# Patient Record
Sex: Male | Born: 1965 | Race: White | Hispanic: No | Marital: Married | Smoking: Never smoker
Health system: Southern US, Community
[De-identification: ages and names within clinical notes are randomized; demographics above are authoritative.]

## PROBLEM LIST (undated history)

## (undated) DIAGNOSIS — N200 Calculus of kidney: Secondary | ICD-10-CM

## (undated) DIAGNOSIS — Z9889 Other specified postprocedural states: Secondary | ICD-10-CM

## (undated) DIAGNOSIS — R112 Nausea with vomiting, unspecified: Secondary | ICD-10-CM

## (undated) DIAGNOSIS — Z8719 Personal history of other diseases of the digestive system: Secondary | ICD-10-CM

## (undated) DIAGNOSIS — Z8489 Family history of other specified conditions: Secondary | ICD-10-CM

## (undated) DIAGNOSIS — Z8739 Personal history of other diseases of the musculoskeletal system and connective tissue: Secondary | ICD-10-CM

## (undated) DIAGNOSIS — J189 Pneumonia, unspecified organism: Secondary | ICD-10-CM

## (undated) DIAGNOSIS — IMO0002 Reserved for concepts with insufficient information to code with codable children: Secondary | ICD-10-CM

## (undated) HISTORY — DX: Reserved for concepts with insufficient information to code with codable children: IMO0002

## (undated) HISTORY — PX: KIDNEY STONE SURGERY: SHX686

## (undated) HISTORY — PX: EYE SURGERY: SHX253

## (undated) HISTORY — PX: LITHOTRIPSY: SUR834

---
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
## 1989-08-05 HISTORY — PX: VARICOSE VEIN SURGERY: SHX832

## 1999-10-04 ENCOUNTER — Encounter: Payer: Self-pay | Admitting: Family Medicine

## 1999-10-04 LAB — CONVERTED CEMR LAB: Rapid Strep: NEGATIVE

## 2001-07-31 ENCOUNTER — Encounter: Payer: Self-pay | Admitting: Orthopedic Surgery

## 2001-07-31 ENCOUNTER — Ambulatory Visit (HOSPITAL_COMMUNITY): Admission: RE | Admit: 2001-07-31 | Discharge: 2001-07-31 | Payer: Self-pay | Admitting: Orthopedic Surgery

## 2002-08-06 ENCOUNTER — Encounter: Payer: Self-pay | Admitting: Family Medicine

## 2002-08-06 LAB — CONVERTED CEMR LAB
Blood Glucose, Fasting: 96 mg/dL
Cholesterol: 285 mg/dL
HDL: 35.7 mg/dL

## 2003-01-20 ENCOUNTER — Encounter: Payer: Self-pay | Admitting: Family Medicine

## 2003-01-20 LAB — CONVERTED CEMR LAB
ALT: 31 units/L
AST: 33 units/L
Cholesterol: 181 mg/dL
HDL: 41 mg/dL
LDL Cholesterol: 124 mg/dL
Triglyceride fasting, serum: 78 mg/dL

## 2004-01-28 ENCOUNTER — Encounter: Payer: Self-pay | Admitting: Family Medicine

## 2004-01-28 LAB — CONVERTED CEMR LAB
ALT: 28 units/L
AST: 29 units/L
Triglyceride fasting, serum: 44 mg/dL

## 2006-05-19 ENCOUNTER — Ambulatory Visit: Payer: Self-pay | Admitting: Family Medicine

## 2006-12-11 ENCOUNTER — Ambulatory Visit: Payer: Self-pay | Admitting: Family Medicine

## 2008-01-18 ENCOUNTER — Telehealth: Payer: Self-pay | Admitting: Family Medicine

## 2008-01-18 ENCOUNTER — Ambulatory Visit: Payer: Self-pay | Admitting: Family Medicine

## 2008-01-18 DIAGNOSIS — J180 Bronchopneumonia, unspecified organism: Secondary | ICD-10-CM | POA: Insufficient documentation

## 2009-05-07 ENCOUNTER — Ambulatory Visit: Payer: Self-pay | Admitting: Family Medicine

## 2009-05-08 ENCOUNTER — Encounter: Payer: Self-pay | Admitting: Family Medicine

## 2009-05-08 DIAGNOSIS — E7800 Pure hypercholesterolemia, unspecified: Secondary | ICD-10-CM

## 2009-05-08 LAB — CONVERTED CEMR LAB: Cholesterol: 285 mg/dL

## 2009-07-31 ENCOUNTER — Ambulatory Visit: Payer: Self-pay | Admitting: Family Medicine

## 2009-08-01 LAB — CONVERTED CEMR LAB
Albumin: 4.1 g/dL (ref 3.5–5.2)
Basophils Absolute: 0 10*3/uL (ref 0.0–0.1)
Basophils Relative: 0 % (ref 0.0–3.0)
Bilirubin, Direct: 0 mg/dL (ref 0.0–0.3)
CO2: 27 meq/L (ref 19–32)
Calcium: 9 mg/dL (ref 8.4–10.5)
Chloride: 110 meq/L (ref 96–112)
Cholesterol: 209 mg/dL — ABNORMAL HIGH (ref 0–200)
Creatinine, Ser: 0.9 mg/dL (ref 0.4–1.5)
Direct LDL: 129.9 mg/dL
Eosinophils Absolute: 0.2 10*3/uL (ref 0.0–0.7)
Glucose, Bld: 97 mg/dL (ref 70–99)
MCHC: 34.3 g/dL (ref 30.0–36.0)
MCV: 91.5 fL (ref 78.0–100.0)
Monocytes Absolute: 0.4 10*3/uL (ref 0.1–1.0)
Neutro Abs: 2.8 10*3/uL (ref 1.4–7.7)
Neutrophils Relative %: 47.6 % (ref 43.0–77.0)
PSA: 0.31 ng/mL (ref 0.10–4.00)
RBC: 4.8 M/uL (ref 4.22–5.81)
RDW: 11.7 % (ref 11.5–14.6)
Total CHOL/HDL Ratio: 6
Total Protein: 6.5 g/dL (ref 6.0–8.3)
Triglycerides: 148 mg/dL (ref 0.0–149.0)

## 2009-08-06 ENCOUNTER — Ambulatory Visit: Payer: Self-pay | Admitting: Family Medicine

## 2010-01-27 ENCOUNTER — Ambulatory Visit: Payer: Self-pay | Admitting: Family Medicine

## 2010-01-27 DIAGNOSIS — J018 Other acute sinusitis: Secondary | ICD-10-CM

## 2010-02-09 ENCOUNTER — Telehealth: Payer: Self-pay | Admitting: Family Medicine

## 2010-07-07 ENCOUNTER — Encounter (INDEPENDENT_AMBULATORY_CARE_PROVIDER_SITE_OTHER): Payer: Self-pay | Admitting: *Deleted

## 2010-09-10 ENCOUNTER — Emergency Department (HOSPITAL_COMMUNITY): Admission: EM | Admit: 2010-09-10 | Discharge: 2010-09-10 | Payer: Self-pay | Admitting: Emergency Medicine

## 2010-09-20 ENCOUNTER — Observation Stay (HOSPITAL_COMMUNITY)
Admission: EM | Admit: 2010-09-20 | Discharge: 2010-09-22 | Payer: Self-pay | Source: Home / Self Care | Admitting: Emergency Medicine

## 2010-09-20 ENCOUNTER — Ambulatory Visit (HOSPITAL_COMMUNITY): Admission: RE | Admit: 2010-09-20 | Discharge: 2010-09-20 | Payer: Self-pay | Admitting: Urology

## 2010-09-24 ENCOUNTER — Ambulatory Visit (HOSPITAL_COMMUNITY): Admission: AD | Admit: 2010-09-24 | Discharge: 2010-09-24 | Payer: Self-pay | Admitting: Urology

## 2010-10-04 ENCOUNTER — Ambulatory Visit (HOSPITAL_COMMUNITY): Admission: RE | Admit: 2010-10-04 | Discharge: 2010-10-04 | Payer: Self-pay | Admitting: Urology

## 2011-01-04 NOTE — Progress Notes (Signed)
Summary: still with cough and congestion  Phone Note Call from Patient Call back at [facility] Phone [phone]   Caller: Patient Summary of Call: Pt was seen on 2/23 and given a z-pack.  He still has cough and congestion, not able to sleep due to cough.  He is asking for a refill on z-pack, [facility].  No known fever.  He has been taking guafenesin, not helping. Initial call taken by: Lowella Petties CMA,  February 09, 2010 10:23 AM  Follow-up for Phone Call        If he feels he needs another Z p[ak, he needs to be seen. Would suggest Take Guaifenesin by [facility] or [facility] and getting MUCOUS RELIEF EXPECTORANT (400mg ), take 11/2 tabs by mouth AM and NOON. Drink lots of fluids anytime taking Guaifenesin. BE VERY SPECIFIC WITH THIS, NOT MUCINEX. Keep lozenge in mouth to cut down on rective cough. Take Tessalon during the day three times a day as needed for cough, Take Tussionex at night as needed for cough. Sxs may last another 2-3 weeks. Follow-up by: Shaune Leeks MD,  February 09, 2010 12:31 PM  Additional Follow-up for Phone Call Additional follow up Details #1::        Advised pt. Additional Follow-up by: Lowella Petties CMA,  February 09, 2010 12:37 PM    New/Updated Medications: TESSALON 200 MG CAPS (BENZONATATE) one tab [o three times a day as needed for cough TUSSIONEX PENNKINETIC ER 8-10 MG/5ML LQCR (CHLORPHENIRAMINE-HYDROCODONE) one tsp by mouth at night as needed for cough. Prescriptions: TUSSIONEX PENNKINETIC ER 8-10 MG/5ML LQCR (CHLORPHENIRAMINE-HYDROCODONE) one tsp by mouth at night as needed for cough.  #8 oz x 0   Entered and Authorized by:   Shaune Leeks MD   Signed by:   Shaune Leeks MD on 02/09/2010   Method used:   Telephoned to ...       [facility] [id number]       [address]       [place], [place]  [address]       Ph: [phone] or [phone]       Fax: [phone]   RxID:    [id number] TESSALON 200 MG CAPS (BENZONATATE) one tab [o three times a day as needed for cough  #20 x 0   Entered and Authorized by:   Shaune Leeks MD   Signed by:   Shaune Leeks MD on 02/09/2010   Method used:   Electronically to        [facility] (retail)       [address]       [place], [place]  [address]       Ph: [phone] or [phone]       Fax: [phone]   RxID:   [id number]

## 2011-01-04 NOTE — Assessment & Plan Note (Signed)
Summary: CONGESTION,COUGH,BODY ACHES/CLE   Vital Signs:  Patient profile:   45 year old male Height:      65 inches Weight:      219.50 pounds BMI:     36.66 Temp:     98.5 degrees F oral Pulse rate:   80 / minute Pulse rhythm:   regular BP sitting:   120 / 84  (left arm) Cuff size:   large  Vitals Entered By: Delilah Shan CMA Duncan Dull) (January 27, 2010 11:32 AM) CC: Congestion, cough, body aches   History of Present Illness: 45 yo with 10 days of congestion, sinus pressure, productive cough. No SOB, fevers, CP, or wheezing. Did have body aches and sweats last night. Taking Theraflu with some relief of symptoms.   Current Medications (verified): 1)  Advil 200 Mg Tabs (Ibuprofen) .... Otc Take As Directed When Needed. 2)  Omega-3 Fish Oil 1000 Mg Caps (Omega-3 Fatty Acids) .Marland Kitchen.. 1 Cpsule Twice A Day 3)  Multivitamins   Tabs (Multiple Vitamin) .... Take 1 Tablet By Mouth Once A Day 4)  Vitamin B-12 500 Mcg  Tabs (Cyanocobalamin) .... Take 1 Tablet By Mouth Once A Day 5)  Aspirin 325 Mg  Tabs (Aspirin) .... Take 1 Tablet By Mouth Once A Day 6)  Azithromycin 250 Mg  Tabs (Azithromycin) .... 2 By  Mouth Today and Then 1 Daily For 4 Days  Allergies: 1)  ! Penicillin  Review of Systems      See HPI General:  Complains of sweats; denies chills and fever. ENT:  Complains of nasal congestion and sinus pressure; denies earache and sore throat. CV:  Denies chest pain or discomfort. Resp:  Complains of cough and sputum productive; denies shortness of breath and wheezing. GI:  Denies abdominal pain, nausea, and vomiting. MS:  Complains of muscle aches; denies muscle weakness. Derm:  Denies rash.  Physical Exam  General:  alert, well-developed, well-nourished, and well-hydrated, sounds congested. Ears:  TMs retracted bilaterally Nose:  nasal dischargemucosal pallor.   sinuses neg Mouth:  Oral mucosa and oropharynx without lesions or exudates.  Teeth in good repair. Lungs:  Normal  respiratory effort, chest expands symmetrically. Lungs are clear to auscultation, no crackles or wheezes. Heart:  Normal rate and regular rhythm. S1 and S2 normal without gallop, murmur, click, rub or other extra sounds. Extremities:  No clubbing, cyanosis, edema, or deformity noted with normal full range of motion of all joints.   Psych:  normally interactive and good eye contact.     Impression & Recommendations:  Problem # 1:  OTHER ACUTE SINUSITIS (ICD-461.8) Assessment New given duration and progression of symptoms, will treat for bacterial sinusitis. Has allergy to PCN, treat with Zpack. Continue supportive care, see pt instructions for details. His updated medication list for this problem includes:    Azithromycin 250 Mg Tabs (Azithromycin) .Marland Kitchen... 2 by  mouth today and then 1 daily for 4 days  Complete Medication List: 1)  Advil 200 Mg Tabs (Ibuprofen) .... Otc take as directed when needed. 2)  Omega-3 Fish Oil 1000 Mg Caps (Omega-3 fatty acids) .Marland Kitchen.. 1 cpsule twice a day 3)  Multivitamins Tabs (Multiple vitamin) .... Take 1 tablet by mouth once a day 4)  Vitamin B-12 500 Mcg Tabs (Cyanocobalamin) .... Take 1 tablet by mouth once a day 5)  Aspirin 325 Mg Tabs (Aspirin) .... Take 1 tablet by mouth once a day 6)  Azithromycin 250 Mg Tabs (Azithromycin) .... 2 by  mouth today and then  1 daily for 4 days  Patient Instructions: 1)  Take antibiotic as directed.  Drink lots of fluids.  Treat sympotmatically with Mucinex, nasal saline irrigation, and Tylenol/Ibuprofen. Also try claritin D or zyrtec D over the counter- two times a day as needed ( have to sign for them at pharmacy). You can use warm compresses.  Cough suppressant at night. Call if not improving as expected in 5-7 days.  Prescriptions: AZITHROMYCIN 250 MG  TABS (AZITHROMYCIN) 2 by  mouth today and then 1 daily for 4 days  #6 x 0   Entered and Authorized by:   Ruthe Mannan MD   Signed by:   Ruthe Mannan MD on 01/27/2010   Method  used:   Electronically to        [facility] (retail)       [address]       [place], [place]  [address]       Ph: [phone] or [phone]       Fax: [phone]   RxID:   [id number]   Current Allergies (reviewed today): ! PENICILLIN

## 2011-01-04 NOTE — Letter (Signed)
Summary: Nadara Eaton letter  [facility] at [facility]  [address] [place], [place] [address]   Phone: [phone]  Fax: [phone]       07/07/2010 MRN: [id number]  [facility] [place] [address] [place], [place]  [address]  Dear Mr. Hirschman,  [facility] [place] and [facility] announce the retirement of Arta Silence, M.D., from full-time practice at the [facility] office effective June 03, 2010 and his plans of returning part-time.  It is important to Dr. Hetty Ely and to our practice that you understand that [facility] has seven physicians in our office for your health care needs.  We will continue to offer the same exceptional care that you have today.    Dr. Hetty Ely has spoken to many of you about his plans for retirement and returning part-time in the fall.   We will continue to work with you through the transition to schedule appointments for you in the office and meet the high standards that [facility] is committed to.   Again, it is with great pleasure that we share the news that Dr. Hetty Ely will return to [facility] at [facility] in October of 2011 with a reduced schedule.    If you have any questions, or would like to request an appointment with one of our physicians, please call us at [phone] and press the option for Scheduling an appointment.  We take pleasure in providing you with excellent patient care and look forward to seeing you at your next office visit.  Our [facility] Physicians are:  Tillman Abide, M.D. Laurita Quint, M.D. Roxy Manns, M.D. Kerby Nora, M.D. Hannah Beat, M.D. Ruthe Mannan, M.D. We proudly welcomed Raechel Ache, M.D. and Eustaquio Boyden, M.D. to the practice in July/August 2011.  Sincerely,  [facility]

## 2011-02-16 LAB — URINE CULTURE: Culture: NO GROWTH

## 2011-02-16 LAB — SURGICAL PCR SCREEN
Staphylococcus aureus: POSITIVE — AB
Staphylococcus aureus: POSITIVE — AB

## 2011-02-17 LAB — URINALYSIS, ROUTINE W REFLEX MICROSCOPIC
Bilirubin Urine: NEGATIVE
Specific Gravity, Urine: 1.031 — ABNORMAL HIGH (ref 1.005–1.030)
pH: 5.5 (ref 5.0–8.0)

## 2011-02-17 LAB — URINE MICROSCOPIC-ADD ON

## 2011-11-03 ENCOUNTER — Encounter: Payer: Self-pay | Admitting: Family Medicine

## 2011-11-03 ENCOUNTER — Ambulatory Visit (INDEPENDENT_AMBULATORY_CARE_PROVIDER_SITE_OTHER): Payer: BC Managed Care – PPO | Admitting: Family Medicine

## 2011-11-03 DIAGNOSIS — J111 Influenza due to unidentified influenza virus with other respiratory manifestations: Secondary | ICD-10-CM | POA: Insufficient documentation

## 2011-11-03 DIAGNOSIS — E7800 Pure hypercholesterolemia, unspecified: Secondary | ICD-10-CM

## 2011-11-03 MED ORDER — OSELTAMIVIR PHOSPHATE 75 MG PO CAPS: 75.0000 mg | ORAL_CAPSULE | Freq: Two times a day (BID) | ORAL | Status: AC

## 2011-11-03 NOTE — Assessment & Plan Note (Signed)
Needs to be rechecked. HE will schedule PE in future.

## 2011-11-03 NOTE — Patient Instructions (Signed)
Tamiflu twice a day for 5 days. Cont to take Guaifenesin (400mg ), take 11/2 tabs by mouth AM and NOON. Get GUAIFENESIN by  [facility] and getting MUCOUS RELIEF EXPECTORANT/CONGESTION. DO NOT GET MUCINEX (Timed Release Guaifenesin)

## 2011-11-03 NOTE — Progress Notes (Signed)
  Subjective:    Patient ID: Johnathan [name], male    DOB: 1966/04/22, 45 y.o.   MRN: [id number]  HPI Pt here as acute appt for congestion, body aches, fever and cough. He feels like he has been run over by a truck. He had fever yesterday but did not check it. With two tyl in him he had temp of 101.6 here. He has bad headache in the frontal area, worsened significantly with cough, no overt ear pain, some organizing ST, no rhinitis, cough productive of yellow sputum, tries not to cough due to headache, no N/V prior but some nausea this AM, no diarrhea, no SOB. He has taken plain Tussin and some tylenol. He also haas some guaifenesin.     Review of SystemsNoncontributory except as above.       Objective:   Physical Exam  Constitutional: He appears well-developed and well-nourished. No distress.       Looks slightly tired.  HENT:  Head: Normocephalic and atraumatic.  Right Ear: External ear normal.  Left Ear: External ear normal.  Nose: Nose normal.  Mouth/Throat: Oropharynx is clear and moist.       TMs dull but nonerythem.  Eyes: Conjunctivae and EOM are normal. Pupils are equal, round, and reactive to light. Right eye exhibits no discharge. Left eye exhibits no discharge.  Neck: Normal range of motion. Neck supple.  Cardiovascular: Normal rate and regular rhythm.   Pulmonary/Chest: Effort normal and breath sounds normal. He has no wheezes.       Slight ronchi, clear with cough.  Lymphadenopathy:    He has no cervical adenopathy.  Skin: He is not diaphoretic.          Assessment & Plan:

## 2011-11-03 NOTE — Assessment & Plan Note (Signed)
First case seen by me this year. Will treat with Tamiflu. See instructions.

## 2011-11-16 ENCOUNTER — Other Ambulatory Visit (HOSPITAL_COMMUNITY): Payer: Self-pay

## 2011-11-16 ENCOUNTER — Emergency Department (HOSPITAL_COMMUNITY)
Admission: EM | Admit: 2011-11-16 | Discharge: 2011-11-16 | Disposition: A | Discharge status: Home / Self Care | Payer: BC Managed Care – PPO | Attending: Emergency Medicine | Admitting: Emergency Medicine

## 2011-11-16 ENCOUNTER — Emergency Department (HOSPITAL_COMMUNITY): Payer: BC Managed Care – PPO

## 2011-11-16 ENCOUNTER — Encounter (HOSPITAL_COMMUNITY): Payer: Self-pay | Admitting: Adult Health

## 2011-11-16 DIAGNOSIS — M79609 Pain in unspecified limb: Secondary | ICD-10-CM | POA: Insufficient documentation

## 2011-11-16 DIAGNOSIS — M722 Plantar fascial fibromatosis: Secondary | ICD-10-CM | POA: Insufficient documentation

## 2011-11-16 MED ORDER — OXYCODONE-ACETAMINOPHEN 5-325 MG PO TABS
1.0000 | ORAL_TABLET | Freq: Once | ORAL | Status: AC
  Administered 2011-11-16: 1 via ORAL
  Filled 2011-11-16: qty 1

## 2011-11-16 MED ORDER — NAPROXEN 500 MG PO TABS: 500.0000 mg | ORAL_TABLET | Freq: Two times a day (BID) | ORAL | Status: AC

## 2011-11-16 MED ORDER — HYDROCODONE-ACETAMINOPHEN 5-325 MG PO TABS: 1.0000 | ORAL_TABLET | ORAL | Status: AC | PRN

## 2011-11-16 NOTE — ED Notes (Signed)
C/o right foot pain. Right ankle edema no redness tender to touch, unable to bear weight on right foot.

## 2011-11-16 NOTE — ED Provider Notes (Signed)
History     CSN: [id number] Arrival date & time: 11/16/2011  4:44 PM   First MD Initiated Contact with Patient 11/16/11 1823      Chief Complaint  Patient presents with  . Foot Pain    (Consider location/radiation/quality/duration/timing/severity/associated sxs/prior treatment) Patient is a 45 y.o. male presenting with lower extremity pain. The history is provided by the patient.  Foot Pain This is a new problem. The current episode started yesterday. Associated symptoms include arthralgias. Pertinent negatives include no chills, fever or joint swelling. The symptoms are aggravated by walking and standing. He has tried nothing for the symptoms.  Pt states he had no injuries to his foot. States at work, was standing on his feet a lot, and developed pain under right heel and right arch of the foot. States pain is worsening. Denies fever, chills, malaise, other previous previous similar episodes.   Past Medical History  Diagnosis Date  . Laceration 1985    left forearm arterial laceration    Past Surgical History  Procedure Date  . Varicose vein surgery 1990's    left thigh, Dr.Humphreys  . 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 Dementia Mother   . Diabetes Mother   . Heart disease Father   . Depression Father     manic  . Hypertension Father   . Alzheimer's disease Father     History  Substance Use Topics  . Smoking status: Never Smoker   . Smokeless tobacco: Never Used  . Alcohol Use: Yes     rarely      Review of Systems  Constitutional: Negative for fever and chills.  HENT: Negative.   Eyes: Negative.   Respiratory: Negative.   Cardiovascular: Negative.   Gastrointestinal: Negative.   Genitourinary: Negative.   Musculoskeletal: Positive for arthralgias. Negative for back pain and joint swelling.  Neurological: Negative.   Psychiatric/Behavioral: Negative.      Allergies  Penicillins  Home Medications   Current Outpatient Rx  Name Route Sig Dispense Refill  . ACETAMINOPHEN 500 MG PO TABS Oral Take 500 mg by mouth as needed. For headache or foot pain    . IBUPROFEN 200 MG PO TABS Oral Take 200 mg by mouth as needed. Headache or foot pain    . OMEGA-3-6-9 PO Oral Take by mouth 2 (two) times daily.        BP 156/98  Pulse 78  Temp(Src) 98.9 F (37.2 C) (Oral)  Resp 20  SpO2 98%  Physical Exam  Nursing note and vitals reviewed. Constitutional: He is oriented to person, place, and time. He appears well-developed and well-nourished. No distress.  HENT:  Head: Normocephalic and atraumatic.  Neck: Neck supple.  Cardiovascular: Normal rate, regular rhythm and normal heart sounds.   Pulmonary/Chest: Effort normal and breath sounds normal. No respiratory distress.  Musculoskeletal:       Normal appearing right foot. Tenderness to palpation under the right heel and right plantar fascia. Pain with foot dorsiflexion. Normal ROM of all toes.   Neurological: He is alert and oriented to person, place, and time.  Skin: Skin is warm and dry.  Psychiatric: He has a normal mood and affect.    ED Course  Procedures (including critical care time)  Labs Reviewed - No data to display Dg Foot Complete Right  11/16/2011  *RADIOLOGY REPORT*  Clinical Data: Pain.  Distant history of surgery.  RIGHT FOOT COMPLETE - 3+ VIEW  Comparison: None.  Findings: No evidence of fracture.  No apparent ankle joint effusion.  No abnormalities seen in the forefoot.  There is a small subchondral cyst within the navicular, probably insignificant.  I do not see a advanced degenerative changes in the midfoot.  No detectable coalition.  There is some calcification within the distal Achilles tendon.  IMPRESSION: No cause of pain identified.  No acute finding.  No significant degenerative changes.  No detectable coalition.  Achilles tendon calcification incidentally noted.   Original Report Authenticated By: Thomasenia Sales, M.D.    Exam suspicious for plantar fasciatis. Negative x-ray. Will d/c home with orthopedics follow up. Advise to get arch supports, wear good shoes.   MDM          Lottie Mussel, PA 11/16/11 1924

## 2011-11-16 NOTE — ED Provider Notes (Signed)
Medical screening examination/treatment/procedure(s) were performed by non-physician practitioner and as supervising physician I was immediately available for consultation/collaboration.  Flint Melter, MD 11/16/11 2329

## 2014-01-25 ENCOUNTER — Emergency Department (HOSPITAL_COMMUNITY)
Admission: EM | Admit: 2014-01-25 | Discharge: 2014-01-25 | Disposition: A | Discharge status: Home / Self Care | Payer: BC Managed Care – PPO | Attending: Emergency Medicine | Admitting: Emergency Medicine

## 2014-01-25 ENCOUNTER — Emergency Department (HOSPITAL_COMMUNITY): Payer: BC Managed Care – PPO

## 2014-01-25 ENCOUNTER — Encounter (HOSPITAL_COMMUNITY): Payer: Self-pay | Admitting: Emergency Medicine

## 2014-01-25 DIAGNOSIS — S0990XA Unspecified injury of head, initial encounter: Secondary | ICD-10-CM

## 2014-01-25 DIAGNOSIS — Z880 Allergy status to penicillin: Secondary | ICD-10-CM | POA: Insufficient documentation

## 2014-01-25 DIAGNOSIS — Y939 Activity, unspecified: Secondary | ICD-10-CM | POA: Insufficient documentation

## 2014-01-25 DIAGNOSIS — Y929 Unspecified place or not applicable: Secondary | ICD-10-CM | POA: Insufficient documentation

## 2014-01-25 DIAGNOSIS — S060XAA Concussion with loss of consciousness status unknown, initial encounter: Secondary | ICD-10-CM | POA: Insufficient documentation

## 2014-01-25 DIAGNOSIS — W010XXA Fall on same level from slipping, tripping and stumbling without subsequent striking against object, initial encounter: Secondary | ICD-10-CM | POA: Insufficient documentation

## 2014-01-25 DIAGNOSIS — S060X9A Concussion with loss of consciousness of unspecified duration, initial encounter: Secondary | ICD-10-CM

## 2014-01-25 DIAGNOSIS — R110 Nausea: Secondary | ICD-10-CM | POA: Insufficient documentation

## 2014-01-25 DIAGNOSIS — Z79899 Other long term (current) drug therapy: Secondary | ICD-10-CM | POA: Insufficient documentation

## 2014-01-25 MED ORDER — ONDANSETRON 4 MG PO TBDP
4.0000 mg | ORAL_TABLET | Freq: Once | ORAL | Status: AC
  Administered 2014-01-25: 4 mg via ORAL
  Filled 2014-01-25: qty 1

## 2014-01-25 NOTE — ED Provider Notes (Signed)
CSN: [id number]     Arrival date & time 01/25/14  0719 History   First MD Initiated Contact with Patient 01/25/14 [phone]     Chief Complaint  Patient presents with  . Head Injury  . Fall     (Consider location/radiation/quality/duration/timing/severity/associated sxs/prior Treatment) HPI  48 year old male presenting with wife after a fall just before arrival. Patient slipped on an icy surface and fell backwards. Patient was filled lying on his back after someone near him or heard a loud noise. Does not appear to loss consciousness. Patient is amnestic to the events. He is unsure what happened or how you got to the hospital. Is complaining of nausea and posterior headache. Denies any neck or back pain. No use of blood thinning medicines. No acute numbness, to loss of strength. No visual complaints. Per wife, repettitive questioning. Amnestic to very recent events.   Past Medical History  Diagnosis Date  . Laceration 1985    left forearm arterial laceration   Past Surgical History  Procedure Laterality Date  . Varicose vein surgery  1990's    left thigh, Dr.Humphreys  . Club foot release  1974    clubfoot surgical correction bilaterial, Dr.Carter   Family History  Problem Relation Age of Onset  . Dementia Mother   . Diabetes Mother   . Heart disease Father   . Depression Father     manic  . Hypertension Father   . Alzheimer's disease Father    History  Substance Use Topics  . Smoking status: Never Smoker   . Smokeless tobacco: Never Used  . Alcohol Use: Yes     Comment: rarely    Review of Systems  All systems reviewed and negative, other than as noted in HPI.   Allergies  Penicillins  Home Medications   Current Outpatient Rx  Name  Route  Sig  Dispense  Refill  . acetaminophen (TYLENOL) 500 MG tablet   Oral   Take 500 mg by mouth as needed. For headache or foot pain         . ibuprofen (ADVIL,MOTRIN) 200 MG tablet   Oral   Take 200 mg by mouth as needed.  Headache or foot pain         . Omega 3-6-9 Fatty Acids (OMEGA-3-6-9 PO)   Oral   Take by mouth 2 (two) times daily.            BP 134/95  Pulse 70  Resp 18  SpO2 97% Physical Exam  Nursing note and vitals reviewed. Constitutional: He appears well-developed and well-nourished. No distress.  HENT:  Head: Normocephalic and atraumatic.  Faint erythema posterior scalp. No break in skin. No cephalohematoma.   Eyes: Conjunctivae are normal. Right eye exhibits no discharge. Left eye exhibits no discharge.  Neck: Neck supple.  Cardiovascular: Normal rate, regular rhythm and normal heart sounds.  Exam reveals no gallop and no friction rub.   No murmur heard. Pulmonary/Chest: Effort normal and breath sounds normal. No respiratory distress.  Abdominal: Soft. He exhibits no distension. There is no tenderness.  Musculoskeletal: He exhibits no edema and no tenderness.  No midline spinal tenderness. No bony tenderness of extremities or apparent pain with ROM of large joints.   Neurological: He is alert.  GCS 14. Amnestic to events earlier today. Responded 2013 when asked what year it is. Couldn't tell me month or day of week. CN 2-12 intact. Strength 5/5 b/l u/l extremities. Gait steady.   Skin: Skin is warm and  dry.  Psychiatric: He has a normal mood and affect. His behavior is normal. Thought content normal.    ED Course  Procedures (including critical care time) Labs Review Labs Reviewed - No data to display Imaging Review No results found.  EKG Interpretation   None       MDM   Final diagnoses:  Head injury, acute  Concussion    42yM with a head injury. Symptoms of concussion. No blood thinners. CT w/o acute traumatic injury. Discussed post-concussive syndrome with pt/wife. Return precautions discussed.    Raeford RazorStephen Kree Rafter, MD 01/25/14 [phone]

## 2014-01-25 NOTE — Discharge Instructions (Signed)

## 2014-01-25 NOTE — ED Notes (Signed)
Pt returned from CT °

## 2014-01-25 NOTE — ED Notes (Addendum)
Pt from home c/o headache  D/t to fall today. Pt slipped on the ice and hit his head. He is unable to recall the fall and repeats " I can't remember". He is aware of where he is and who the president but not the month. He doesn't recall what he suppose to do today or where he was going today. No use of blood thinners. No visual changes. C/o of nausea w/o vomiting.

## 2014-12-24 IMAGING — CT CT HEAD W/O CM
2 series · 17 of 30 positions shown, 20 images · non-contrast
Comparison: None.

CLINICAL DATA: Headache status post fall.  Injury to back of head.

EXAM:
CT HEAD WITHOUT CONTRAST
TECHNIQUE: Contiguous axial images were obtained from the base of the skull
through the vertex without intravenous contrast.

[Series 2: head w/o · axial · non-contrast · 0.48mm/px · z∈[-149,-29]mm · 9 of 30 slices shown, 12 images]
[im 3/30  brain]
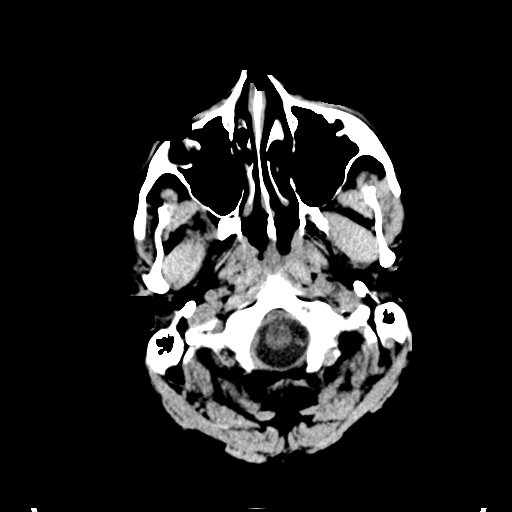
[im 3/30  bone]
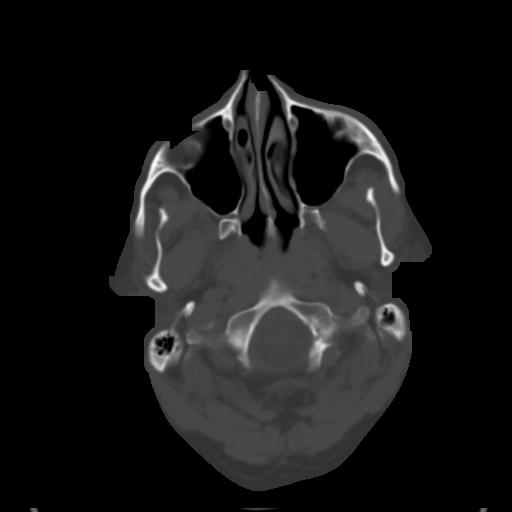
[im 6/30  brain]
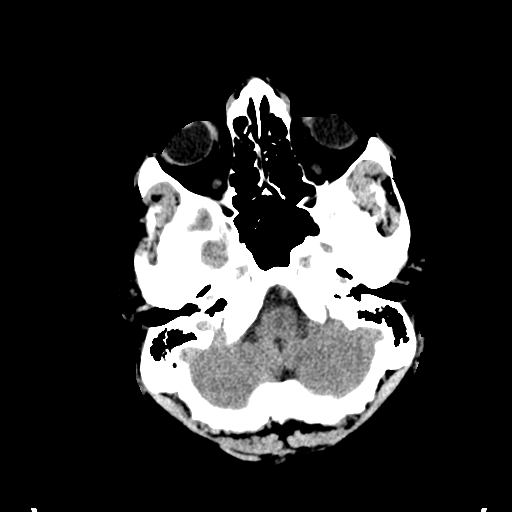
[im 9/30  brain]
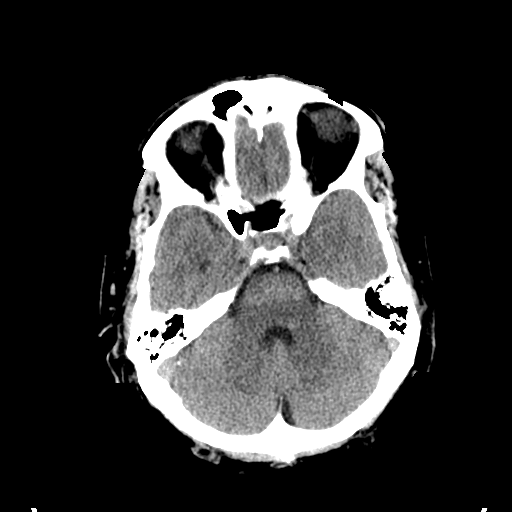
[im 12/30  brain]
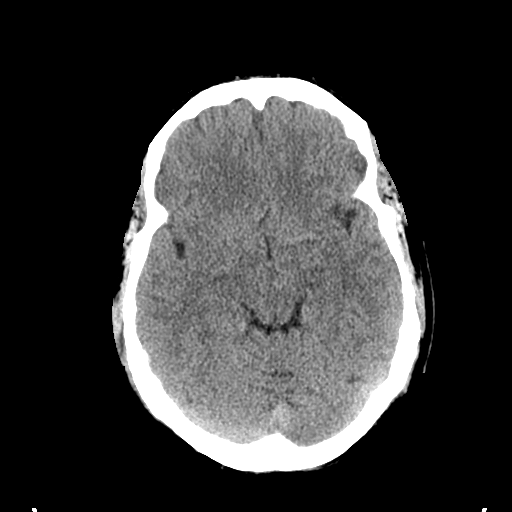
[im 15/30  brain]
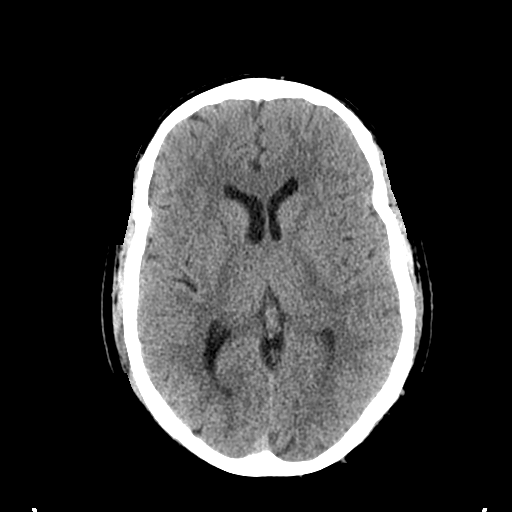
[im 15/30  bone]
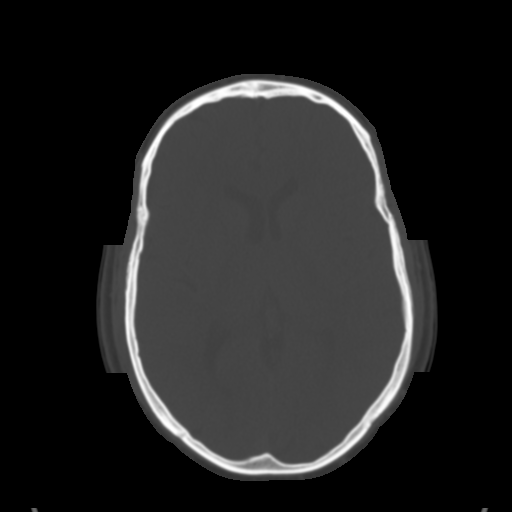
[im 18/30  brain]
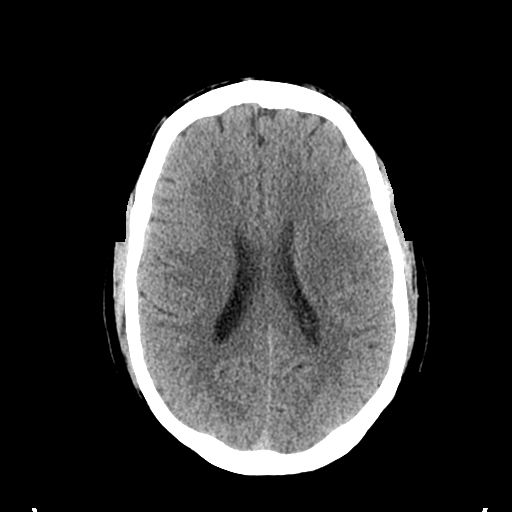
[im 21/30  brain]
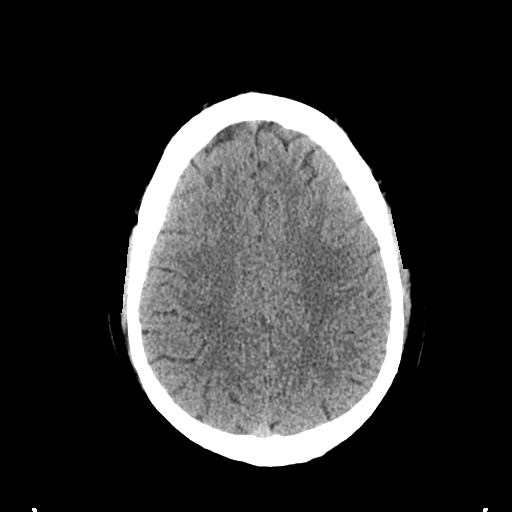
[im 24/30  brain]
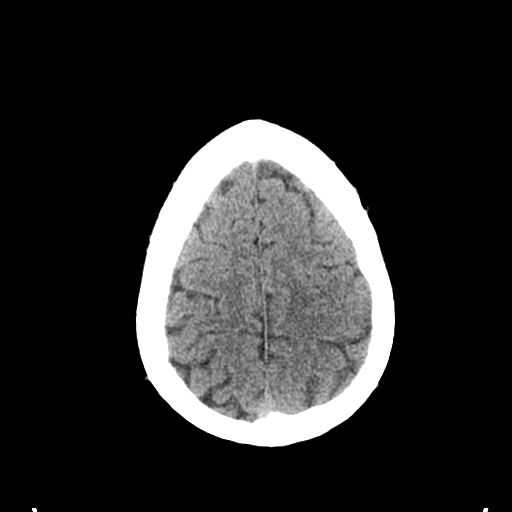
[im 27/30  brain]
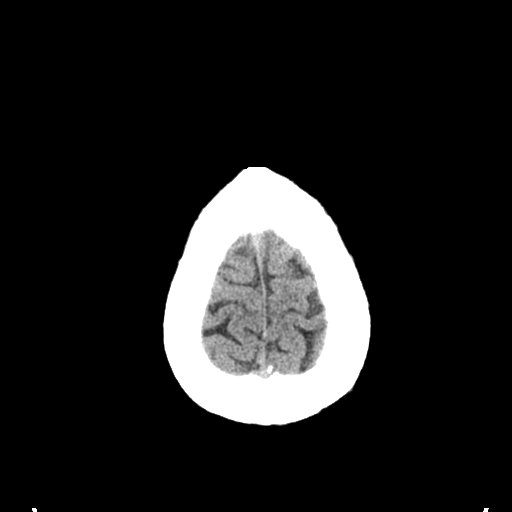
[im 27/30  bone]
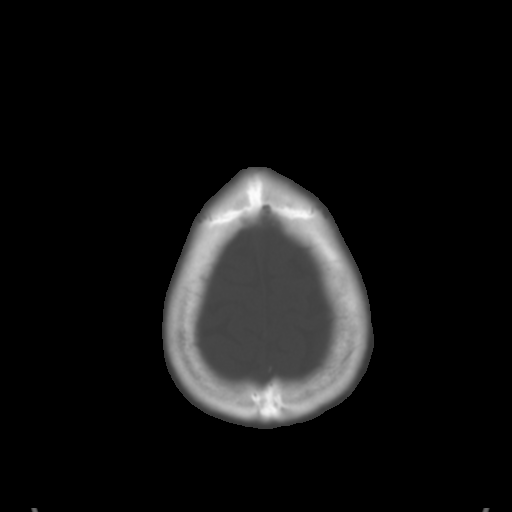

[Series 3: bone windows · axial · 0.48mm/px · z∈[-144,-30]mm · 8 of 50 slices shown]
[im 6/50  bone]
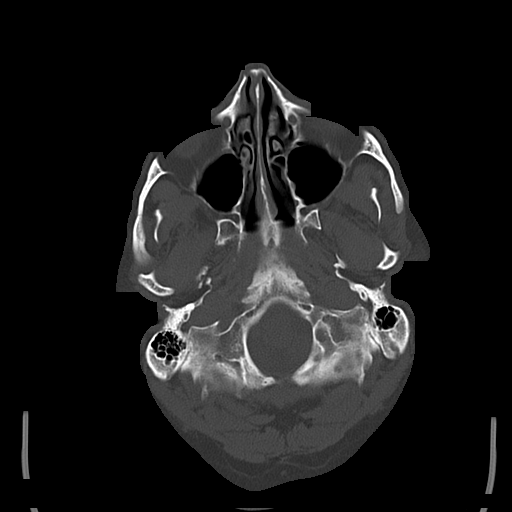
[im 11/50  bone]
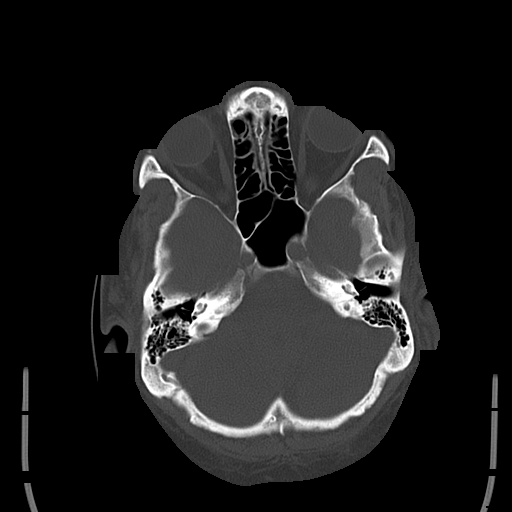
[im 17/50  bone]
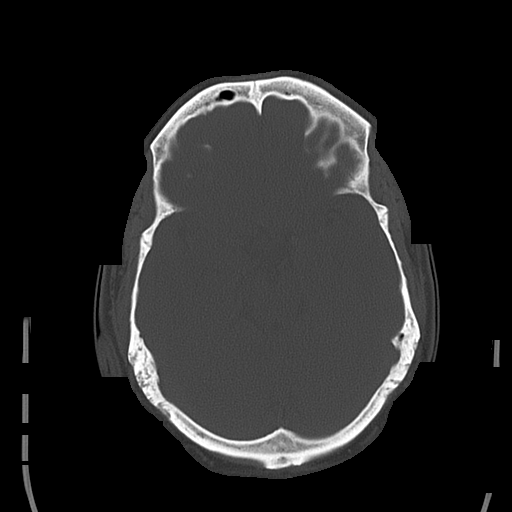
[im 22/50  bone]
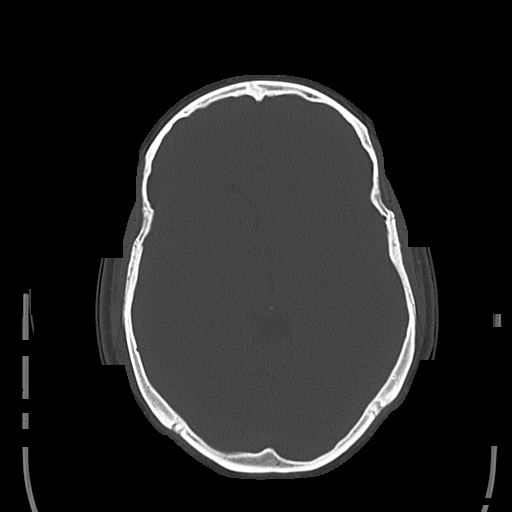
[im 28/50  bone]
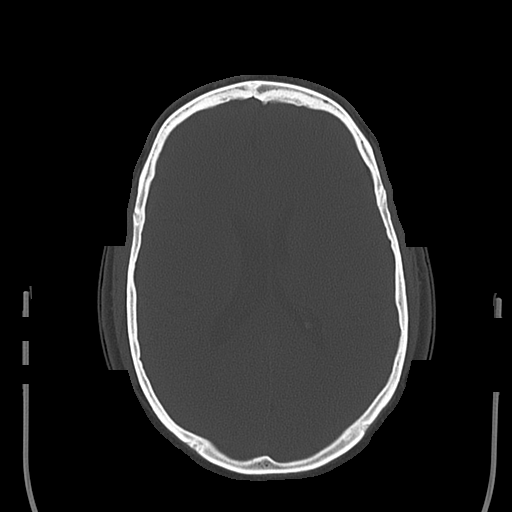
[im 33/50  bone]
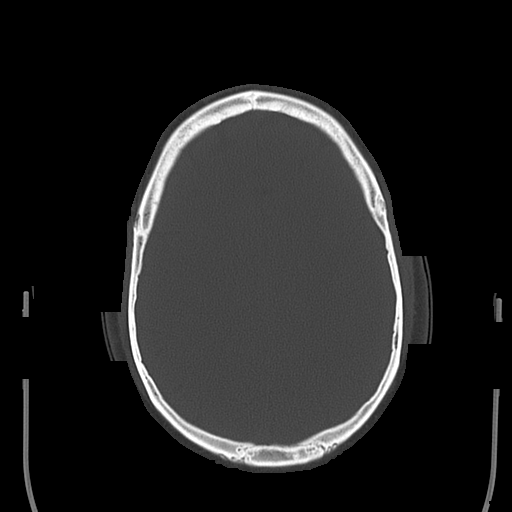
[im 39/50  bone]
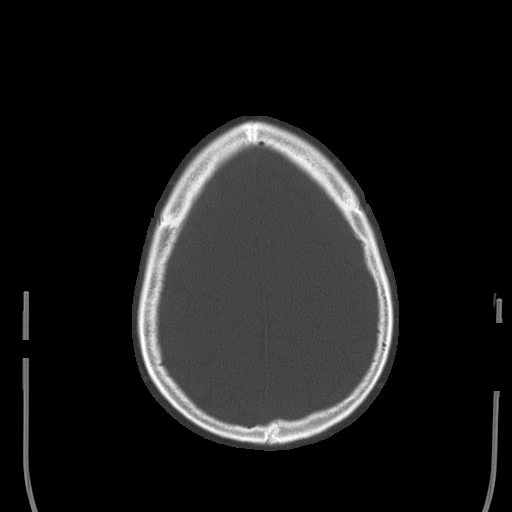
[im 44/50  bone]
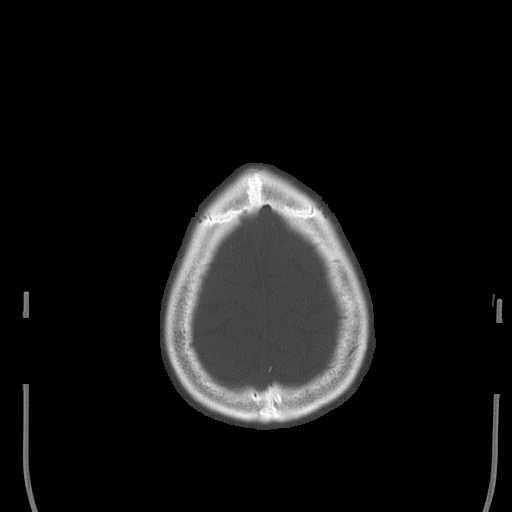

[17 of 30 positions shown; findings below may reference images not displayed]

FINDINGS: The ventricles and sulci are within normal limits for age. There is
no evidence of acute infarct, intracranial hemorrhage, mass, midline
shift, or extra-axial collection. The orbits are unremarkable. Tiny
left maxillary sinus mucous retention cyst is noted. The mastoid air
cells are clear. There is no evidence of acute fracture.
IMPRESSION: No evidence of acute intracranial abnormality.

## 2015-12-06 DIAGNOSIS — J189 Pneumonia, unspecified organism: Secondary | ICD-10-CM

## 2015-12-06 HISTORY — DX: Pneumonia, unspecified organism: J18.9

## 2016-05-16 ENCOUNTER — Encounter (INDEPENDENT_AMBULATORY_CARE_PROVIDER_SITE_OTHER): Payer: BLUE CROSS/BLUE SHIELD | Admitting: Ophthalmology

## 2016-05-16 DIAGNOSIS — H43813 Vitreous degeneration, bilateral: Secondary | ICD-10-CM | POA: Diagnosis not present

## 2016-05-16 DIAGNOSIS — H33022 Retinal detachment with multiple breaks, left eye: Secondary | ICD-10-CM

## 2016-05-16 DIAGNOSIS — H4312 Vitreous hemorrhage, left eye: Secondary | ICD-10-CM

## 2016-05-23 ENCOUNTER — Ambulatory Visit (INDEPENDENT_AMBULATORY_CARE_PROVIDER_SITE_OTHER): Payer: BLUE CROSS/BLUE SHIELD | Admitting: Ophthalmology

## 2016-05-23 DIAGNOSIS — H33022 Retinal detachment with multiple breaks, left eye: Secondary | ICD-10-CM

## 2016-05-23 NOTE — H&P (Signed)
Paula LibraJoshua O Pondexter is an 50 y.o. male.   Chief Complaint:Sudden floaters left eye HPI: Found to have superior limited retinal detachment with vitreous hemorrhage.  Walled off with laser, now needs pars plana vitrectomy with gas, cryo and bubble left eye  Past Medical History  Diagnosis Date  . Laceration 1985    left forearm arterial laceration    Past Surgical History  Procedure Laterality Date  . Varicose vein surgery  1990's    left thigh, Dr.Humphreys  . Club foot release  1974    clubfoot surgical correction bilaterial, Dr.Carter    Family History  Problem Relation Age of Onset  . Dementia Mother   . Diabetes Mother   . Heart disease Father   . Depression Father     manic  . Hypertension Father   . Alzheimer's disease Father    Social History:  reports that he has never smoked. He has never used smokeless tobacco. He reports that he drinks alcohol. He reports that he does not use illicit drugs.  Allergies:  Allergies  Allergen Reactions  . Penicillins Hives and Rash    REACTION: rash    No prescriptions prior to admission    Review of systems otherwise negative  There were no vitals taken for this visit.  Physical exam: Mental status: oriented x3. Eyes: See eye exam associated with this date of surgery in media tab.  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  Possible scleral buckle left eye  [name], Tamel Abel D 05/23/2016, 5:21 PM

## 2016-06-13 ENCOUNTER — Encounter (HOSPITAL_COMMUNITY): Payer: Self-pay | Admitting: *Deleted

## 2016-06-13 MED ORDER — CLINDAMYCIN PHOSPHATE 600 MG/50ML IV SOLN
600.0000 mg | Freq: Once | INTRAVENOUS | Status: AC
  Administered 2016-06-14: 600 mg via INTRAVENOUS
  Filled 2016-06-13: qty 50

## 2016-06-13 MED ORDER — PHENYLEPHRINE HCL 2.5 % OP SOLN
1.0000 [drp] | OPHTHALMIC | Status: AC | PRN
  Administered 2016-06-14: 1 [drp] via OPHTHALMIC
  Filled 2016-06-13: qty 2

## 2016-06-13 MED ORDER — GATIFLOXACIN 0.5 % OP SOLN
1.0000 [drp] | OPHTHALMIC | Status: AC | PRN
  Administered 2016-06-14 (×2): 1 [drp] via OPHTHALMIC
  Filled 2016-06-13: qty 2.5

## 2016-06-13 MED ORDER — CYCLOPENTOLATE HCL 1 % OP SOLN
1.0000 [drp] | OPHTHALMIC | Status: AC | PRN
  Administered 2016-06-14 (×2): 1 [drp] via OPHTHALMIC
  Filled 2016-06-13: qty 2

## 2016-06-13 MED ORDER — TROPICAMIDE 1 % OP SOLN
1.0000 [drp] | OPHTHALMIC | Status: AC | PRN
  Administered 2016-06-14: 1 [drp] via OPHTHALMIC
  Filled 2016-06-13: qty 3

## 2016-06-13 NOTE — Progress Notes (Signed)
Pt denies cardiac history, chest pain or sob. 

## 2016-06-14 ENCOUNTER — Ambulatory Visit (HOSPITAL_COMMUNITY)
Admission: RE | Admit: 2016-06-14 | Discharge: 2016-06-15 | Disposition: A | Discharge status: Home / Self Care | Payer: BLUE CROSS/BLUE SHIELD | Source: Ambulatory Visit | Attending: Ophthalmology | Admitting: Ophthalmology

## 2016-06-14 ENCOUNTER — Encounter (HOSPITAL_COMMUNITY)
Admission: RE | Disposition: A | Discharge status: Home / Self Care | Payer: Self-pay | Source: Ambulatory Visit | Attending: Ophthalmology

## 2016-06-14 ENCOUNTER — Ambulatory Visit (HOSPITAL_COMMUNITY): Payer: BLUE CROSS/BLUE SHIELD | Admitting: Anesthesiology

## 2016-06-14 ENCOUNTER — Encounter (HOSPITAL_COMMUNITY): Payer: Self-pay | Admitting: Anesthesiology

## 2016-06-14 ENCOUNTER — Encounter (INDEPENDENT_AMBULATORY_CARE_PROVIDER_SITE_OTHER): Payer: BLUE CROSS/BLUE SHIELD | Admitting: Ophthalmology

## 2016-06-14 DIAGNOSIS — Z8249 Family history of ischemic heart disease and other diseases of the circulatory system: Secondary | ICD-10-CM | POA: Diagnosis not present

## 2016-06-14 DIAGNOSIS — H3320 Serous retinal detachment, unspecified eye: Secondary | ICD-10-CM | POA: Diagnosis present

## 2016-06-14 DIAGNOSIS — Z833 Family history of diabetes mellitus: Secondary | ICD-10-CM | POA: Diagnosis not present

## 2016-06-14 DIAGNOSIS — Z880 Allergy status to penicillin: Secondary | ICD-10-CM | POA: Insufficient documentation

## 2016-06-14 DIAGNOSIS — H33022 Retinal detachment with multiple breaks, left eye: Secondary | ICD-10-CM

## 2016-06-14 DIAGNOSIS — H33012 Retinal detachment with single break, left eye: Secondary | ICD-10-CM | POA: Diagnosis not present

## 2016-06-14 DIAGNOSIS — Z818 Family history of other mental and behavioral disorders: Secondary | ICD-10-CM | POA: Insufficient documentation

## 2016-06-14 DIAGNOSIS — H33002 Unspecified retinal detachment with retinal break, left eye: Secondary | ICD-10-CM | POA: Diagnosis present

## 2016-06-14 DIAGNOSIS — Z820 Family history of epilepsy and other diseases of the nervous system: Secondary | ICD-10-CM | POA: Diagnosis not present

## 2016-06-14 DIAGNOSIS — H43312 Vitreous membranes and strands, left eye: Secondary | ICD-10-CM | POA: Insufficient documentation

## 2016-06-14 DIAGNOSIS — H4312 Vitreous hemorrhage, left eye: Secondary | ICD-10-CM

## 2016-06-14 HISTORY — DX: Personal history of other diseases of the digestive system: Z87.19

## 2016-06-14 HISTORY — DX: Other specified postprocedural states: R11.2

## 2016-06-14 HISTORY — DX: Family history of other specified conditions: Z84.89

## 2016-06-14 HISTORY — PX: PARS PLANA REPAIR OF RETINAL DEATACHMENT: SHX2165

## 2016-06-14 HISTORY — DX: Pneumonia, unspecified organism: J18.9

## 2016-06-14 HISTORY — DX: Calculus of kidney: N20.0

## 2016-06-14 HISTORY — DX: Personal history of other diseases of the musculoskeletal system and connective tissue: Z87.39

## 2016-06-14 HISTORY — DX: Other specified postprocedural states: Z98.890

## 2016-06-14 HISTORY — PX: PARS PLANA VITRECTOMY: SHX2166

## 2016-06-14 LAB — BASIC METABOLIC PANEL
Anion gap: 5 (ref 5–15)
BUN: 18 mg/dL (ref 6–20)
CHLORIDE: 111 mmol/L (ref 101–111)
CO2: 22 mmol/L (ref 22–32)
CREATININE: 0.75 mg/dL (ref 0.61–1.24)
Calcium: 8.8 mg/dL — ABNORMAL LOW (ref 8.9–10.3)
GFR calc non Af Amer: >60 mL/min (ref >60)
Glucose, Bld: 100 mg/dL — ABNORMAL HIGH (ref 65–99)
POTASSIUM: 4.7 mmol/L (ref 3.5–5.1)
SODIUM: 138 mmol/L (ref 135–145)

## 2016-06-14 LAB — CBC
HCT: 45 % (ref 39.0–52.0)
HEMOGLOBIN: 15 g/dL (ref 13.0–17.0)
MCH: 30.2 pg (ref 26.0–34.0)
MCHC: 33.3 g/dL (ref 30.0–36.0)
MCV: 90.5 fL (ref 78.0–100.0)
Platelets: 238 10*3/uL (ref 150–400)
RBC: 4.97 MIL/uL (ref 4.22–5.81)
RDW: 12.5 % (ref 11.5–15.5)
WBC: 6.2 10*3/uL (ref 4.0–10.5)

## 2016-06-14 SURGERY — PARS PLANA VITRECTOMY WITH 25 GAUGE
Anesthesia: General | Site: Eye | Laterality: Left

## 2016-06-14 MED ORDER — BACITRACIN-POLYMYXIN B 500-10000 UNIT/GM OP OINT
1.0000 "application " | TOPICAL_OINTMENT | Freq: Three times a day (TID) | OPHTHALMIC | Status: DC
  Filled 2016-06-14: qty 3.5

## 2016-06-14 MED ORDER — DEXAMETHASONE SODIUM PHOSPHATE 10 MG/ML IJ SOLN
INTRAMUSCULAR | Status: AC
  Filled 2016-06-14: qty 1

## 2016-06-14 MED ORDER — MORPHINE SULFATE (PF) 2 MG/ML IV SOLN
1.0000 mg | INTRAVENOUS | Status: DC | PRN
Start: 2016-06-14 — End: 2016-06-15
  Administered 2016-06-14: 4 mg via INTRAVENOUS
  Filled 2016-06-14: qty 2

## 2016-06-14 MED ORDER — HYPROMELLOSE (GONIOSCOPIC) 2.5 % OP SOLN
OPHTHALMIC | Status: AC
  Filled 2016-06-14: qty 15

## 2016-06-14 MED ORDER — STERILE WATER FOR INJECTION IJ SOLN
INTRAMUSCULAR | Status: AC
  Filled 2016-06-14: qty 20

## 2016-06-14 MED ORDER — HYDROCODONE-ACETAMINOPHEN 5-325 MG PO TABS: 1.0000 | ORAL_TABLET | ORAL | Status: DC | PRN

## 2016-06-14 MED ORDER — SODIUM HYALURONATE 10 MG/ML IO SOLN
INTRAOCULAR | Status: AC
  Filled 2016-06-14: qty 0.85

## 2016-06-14 MED ORDER — TROPICAMIDE 1 % OP SOLN
1.0000 [drp] | OPHTHALMIC | Status: AC | PRN
  Administered 2016-06-14 (×2): 1 [drp] via OPHTHALMIC

## 2016-06-14 MED ORDER — PHENYLEPHRINE HCL 2.5 % OP SOLN
1.0000 [drp] | OPHTHALMIC | Status: AC | PRN
  Administered 2016-06-14 (×2): 1 [drp] via OPHTHALMIC

## 2016-06-14 MED ORDER — TRIAMCINOLONE ACETONIDE 40 MG/ML IJ SUSP
INTRAMUSCULAR | Status: AC
  Filled 2016-06-14: qty 5

## 2016-06-14 MED ORDER — DEXAMETHASONE SODIUM PHOSPHATE 10 MG/ML IJ SOLN
INTRAMUSCULAR | Status: DC | PRN
Start: 2016-06-14 — End: 2016-06-14
  Administered 2016-06-14: 10 mg

## 2016-06-14 MED ORDER — EPINEPHRINE HCL 1 MG/ML IJ SOLN
INTRAMUSCULAR | Status: AC
  Filled 2016-06-14: qty 1

## 2016-06-14 MED ORDER — BUPIVACAINE HCL (PF) 0.75 % IJ SOLN
INTRAMUSCULAR | Status: DC | PRN
  Administered 2016-06-14: 10 mL

## 2016-06-14 MED ORDER — STERILE WATER FOR INJECTION IJ SOLN
INTRAMUSCULAR | Status: DC | PRN
  Administered 2016-06-14: 20 mL

## 2016-06-14 MED ORDER — BUPIVACAINE HCL (PF) 0.75 % IJ SOLN
INTRAMUSCULAR | Status: AC
Start: 2016-06-14 — End: 2016-06-14
  Filled 2016-06-14: qty 10

## 2016-06-14 MED ORDER — FENTANYL CITRATE (PF) 250 MCG/5ML IJ SOLN
INTRAMUSCULAR | Status: AC
  Filled 2016-06-14: qty 5

## 2016-06-14 MED ORDER — EPINEPHRINE HCL 1 MG/ML IJ SOLN
INTRAOCULAR | Status: DC | PRN
  Administered 2016-06-14: 500 mL via OPHTHALMIC

## 2016-06-14 MED ORDER — BACITRACIN-POLYMYXIN B 500-10000 UNIT/GM OP OINT
TOPICAL_OINTMENT | OPHTHALMIC | Status: AC
  Filled 2016-06-14: qty 3.5

## 2016-06-14 MED ORDER — BSS IO SOLN
INTRAOCULAR | Status: AC
  Filled 2016-06-14: qty 15

## 2016-06-14 MED ORDER — OXYCODONE HCL 5 MG/5ML PO SOLN: 5.0000 mg | Freq: Once | ORAL | Status: DC | PRN

## 2016-06-14 MED ORDER — LATANOPROST 0.005 % OP SOLN
1.0000 [drp] | Freq: Every day | OPHTHALMIC | Status: DC
  Filled 2016-06-14: qty 2.5

## 2016-06-14 MED ORDER — STERILE WATER FOR IRRIGATION IR SOLN
Status: DC | PRN
  Administered 2016-06-14: 1000 mL

## 2016-06-14 MED ORDER — PREDNISOLONE ACETATE 1 % OP SUSP
1.0000 [drp] | Freq: Four times a day (QID) | OPHTHALMIC | Status: DC
  Filled 2016-06-14: qty 5

## 2016-06-14 MED ORDER — LACTATED RINGERS IV SOLN
INTRAVENOUS | Status: DC
  Administered 2016-06-14 (×2): via INTRAVENOUS

## 2016-06-14 MED ORDER — TEMAZEPAM 15 MG PO CAPS: 15.0000 mg | ORAL_CAPSULE | Freq: Every evening | ORAL | Status: DC | PRN

## 2016-06-14 MED ORDER — TETRACAINE HCL 0.5 % OP SOLN
2.0000 [drp] | Freq: Once | OPHTHALMIC | Status: DC
  Filled 2016-06-14: qty 2

## 2016-06-14 MED ORDER — ONDANSETRON HCL 4 MG/2ML IJ SOLN
4.0000 mg | Freq: Four times a day (QID) | INTRAMUSCULAR | Status: DC
  Administered 2016-06-14 – 2016-06-15 (×2): 4 mg via INTRAVENOUS
  Filled 2016-06-14 (×2): qty 2

## 2016-06-14 MED ORDER — LIDOCAINE 2% (20 MG/ML) 5 ML SYRINGE
INTRAMUSCULAR | Status: AC
  Filled 2016-06-14: qty 5

## 2016-06-14 MED ORDER — CYCLOPENTOLATE HCL 1 % OP SOLN
1.0000 [drp] | OPHTHALMIC | Status: DC | PRN
  Administered 2016-06-14: 1 [drp] via OPHTHALMIC

## 2016-06-14 MED ORDER — PROMETHAZINE HCL 25 MG/ML IJ SOLN: 6.2500 mg | INTRAMUSCULAR | Status: DC | PRN

## 2016-06-14 MED ORDER — SCOPOLAMINE 1 MG/3DAYS TD PT72
MEDICATED_PATCH | TRANSDERMAL | Status: AC
  Filled 2016-06-14: qty 1

## 2016-06-14 MED ORDER — 0.9 % SODIUM CHLORIDE (POUR BTL) OPTIME
TOPICAL | Status: DC | PRN
  Administered 2016-06-14: 1000 mL

## 2016-06-14 MED ORDER — SUGAMMADEX SODIUM 200 MG/2ML IV SOLN
INTRAVENOUS | Status: DC | PRN
  Administered 2016-06-14: 186 mg via INTRAVENOUS

## 2016-06-14 MED ORDER — SUGAMMADEX SODIUM 200 MG/2ML IV SOLN
INTRAVENOUS | Status: AC
  Filled 2016-06-14: qty 2

## 2016-06-14 MED ORDER — SODIUM CHLORIDE 0.45 % IV SOLN
INTRAVENOUS | Status: DC
  Administered 2016-06-14: 15:00:00 via INTRAVENOUS

## 2016-06-14 MED ORDER — MIDAZOLAM HCL 2 MG/2ML IJ SOLN
INTRAMUSCULAR | Status: AC
  Filled 2016-06-14: qty 2

## 2016-06-14 MED ORDER — GATIFLOXACIN 0.5 % OP SOLN
1.0000 [drp] | Freq: Four times a day (QID) | OPHTHALMIC | Status: DC
  Filled 2016-06-14: qty 2.5

## 2016-06-14 MED ORDER — ACETAZOLAMIDE SODIUM 500 MG IJ SOLR
500.0000 mg | Freq: Once | INTRAMUSCULAR | Status: AC
  Administered 2016-06-15: 500 mg via INTRAVENOUS
  Filled 2016-06-14: qty 500

## 2016-06-14 MED ORDER — ROCURONIUM BROMIDE 100 MG/10ML IV SOLN
INTRAVENOUS | Status: DC | PRN
  Administered 2016-06-14: 40 mg via INTRAVENOUS

## 2016-06-14 MED ORDER — BRIMONIDINE TARTRATE 0.2 % OP SOLN
1.0000 [drp] | Freq: Two times a day (BID) | OPHTHALMIC | Status: DC
  Filled 2016-06-14: qty 5

## 2016-06-14 MED ORDER — BUPIVACAINE-EPINEPHRINE (PF) 0.25% -1:200000 IJ SOLN
INTRAMUSCULAR | Status: AC
Start: 2016-06-14 — End: 2016-06-14
  Filled 2016-06-14: qty 30

## 2016-06-14 MED ORDER — LIDOCAINE HCL 2 % IJ SOLN
INTRAMUSCULAR | Status: AC
  Filled 2016-06-14: qty 20

## 2016-06-14 MED ORDER — LIDOCAINE HCL (CARDIAC) 20 MG/ML IV SOLN
INTRAVENOUS | Status: DC | PRN
  Administered 2016-06-14: 60 mg via INTRAVENOUS

## 2016-06-14 MED ORDER — ACETAZOLAMIDE SODIUM 500 MG IJ SOLR
INTRAMUSCULAR | Status: AC
  Filled 2016-06-14: qty 500

## 2016-06-14 MED ORDER — CEFTAZIDIME 1 G IJ SOLR
INTRAMUSCULAR | Status: AC
  Filled 2016-06-14: qty 1

## 2016-06-14 MED ORDER — GATIFLOXACIN 0.5 % OP SOLN
1.0000 [drp] | OPHTHALMIC | Status: DC | PRN
  Administered 2016-06-14: 1 [drp] via OPHTHALMIC

## 2016-06-14 MED ORDER — FENTANYL CITRATE (PF) 100 MCG/2ML IJ SOLN
INTRAMUSCULAR | Status: DC | PRN
  Administered 2016-06-14: 50 ug via INTRAVENOUS
  Administered 2016-06-14: 25 ug via INTRAVENOUS
  Administered 2016-06-14: 75 ug via INTRAVENOUS

## 2016-06-14 MED ORDER — BACITRACIN-POLYMYXIN B 500-10000 UNIT/GM OP OINT
TOPICAL_OINTMENT | OPHTHALMIC | Status: DC | PRN
  Administered 2016-06-14: 1 via OPHTHALMIC

## 2016-06-14 MED ORDER — SCOPOLAMINE 1 MG/3DAYS TD PT72
1.0000 | MEDICATED_PATCH | TRANSDERMAL | Status: DC
  Administered 2016-06-14: 1.5 mg via TRANSDERMAL

## 2016-06-14 MED ORDER — OXYCODONE HCL 5 MG PO TABS: 5.0000 mg | ORAL_TABLET | Freq: Once | ORAL | Status: DC | PRN

## 2016-06-14 MED ORDER — FENTANYL CITRATE (PF) 100 MCG/2ML IJ SOLN: 25.0000 ug | INTRAMUSCULAR | Status: DC | PRN

## 2016-06-14 MED ORDER — MAGNESIUM HYDROXIDE 400 MG/5ML PO SUSP: 15.0000 mL | Freq: Four times a day (QID) | ORAL | Status: DC | PRN

## 2016-06-14 MED ORDER — HYALURONIDASE HUMAN 150 UNIT/ML IJ SOLN
INTRAMUSCULAR | Status: AC
  Filled 2016-06-14: qty 1

## 2016-06-14 MED ORDER — MIDAZOLAM HCL 5 MG/5ML IJ SOLN
INTRAMUSCULAR | Status: DC | PRN
  Administered 2016-06-14 (×2): 1 mg via INTRAVENOUS

## 2016-06-14 MED ORDER — BSS IO SOLN
INTRAOCULAR | Status: DC | PRN
  Administered 2016-06-14: 15 mL

## 2016-06-14 MED ORDER — BSS PLUS IO SOLN
INTRAOCULAR | Status: AC
  Filled 2016-06-14: qty 500

## 2016-06-14 MED ORDER — SODIUM HYALURONATE 10 MG/ML IO SOLN
INTRAOCULAR | Status: DC | PRN
  Administered 2016-06-14: 0.85 mL via INTRAOCULAR

## 2016-06-14 MED ORDER — PROPOFOL 10 MG/ML IV BOLUS
INTRAVENOUS | Status: DC | PRN
  Administered 2016-06-14: 200 mg via INTRAVENOUS

## 2016-06-14 MED ORDER — ACETAMINOPHEN 325 MG PO TABS: 325.0000 mg | ORAL_TABLET | ORAL | Status: DC | PRN

## 2016-06-14 MED ORDER — SODIUM CHLORIDE 0.9 % IJ SOLN
INTRAMUSCULAR | Status: AC
  Filled 2016-06-14: qty 10

## 2016-06-14 MED ORDER — ROCURONIUM BROMIDE 50 MG/5ML IV SOLN
INTRAVENOUS | Status: AC
  Filled 2016-06-14: qty 1

## 2016-06-14 MED ORDER — POLYMYXIN B SULFATE 500000 UNITS IJ SOLR
INTRAMUSCULAR | Status: AC
  Filled 2016-06-14: qty 1

## 2016-06-14 MED ORDER — HEMOSTATIC AGENTS (NO CHARGE) OPTIME
TOPICAL | Status: DC | PRN
  Administered 2016-06-14: 1 via TOPICAL

## 2016-06-14 MED ORDER — PROPOFOL 10 MG/ML IV BOLUS
INTRAVENOUS | Status: AC
  Filled 2016-06-14: qty 20

## 2016-06-14 MED ORDER — ATROPINE SULFATE 1 % OP SOLN
OPHTHALMIC | Status: AC
  Filled 2016-06-14: qty 5

## 2016-06-14 SURGICAL SUPPLY — 72 items
APL SRG 3 HI ABS STRL LF PLS (MISCELLANEOUS)
APPLICATOR DR MATTHEWS STRL (MISCELLANEOUS) IMPLANT
BALL CTTN LRG ABS STRL LF (GAUZE/BANDAGES/DRESSINGS) ×3
BANDAGE EYE OVAL (MISCELLANEOUS) ×4 IMPLANT
BLADE EYE CATARACT 19 1.4 BEAV (BLADE) IMPLANT
BLADE MVR KNIFE 19G (BLADE) IMPLANT
BLADE MVR KNIFE 20G (BLADE) IMPLANT
CANNULA DUAL BORE 23G (CANNULA) IMPLANT
CANNULA VLV SOFT TIP 25G (OPHTHALMIC) ×1 IMPLANT
CANNULA VLV SOFT TIP 25GA (OPHTHALMIC) ×3 IMPLANT
CORDS BIPOLAR (ELECTRODE) IMPLANT
COTTONBALL LRG STERILE PKG (GAUZE/BANDAGES/DRESSINGS) ×9 IMPLANT
DRAPE OPHTHALMIC 77X100 STRL (CUSTOM PROCEDURE TRAY) ×3 IMPLANT
FILTER BLUE MILLIPORE (MISCELLANEOUS) IMPLANT
FILTER STRAW FLUID ASPIR (MISCELLANEOUS) IMPLANT
FORCEPS ECKARDT ILM 25G SERR (OPHTHALMIC RELATED) IMPLANT
FORCEPS GRIESHABER ILM 25G A (INSTRUMENTS) IMPLANT
GAS AUTO FILL CONSTEL (OPHTHALMIC) ×3
GAS AUTO FILL CONSTELLATION (OPHTHALMIC) IMPLANT
GLOVE SS BIOGEL STRL SZ 6.5 (GLOVE) ×1 IMPLANT
GLOVE SS BIOGEL STRL SZ 7 (GLOVE) ×1 IMPLANT
GLOVE SUPERSENSE BIOGEL SZ 6.5 (GLOVE) ×2
GLOVE SUPERSENSE BIOGEL SZ 7 (GLOVE) ×2
GLOVE SURG 8.5 LATEX PF (GLOVE) ×5 IMPLANT
GLOVE SURG SS PI 8.0 STRL IVOR (GLOVE) ×2 IMPLANT
GOWN STRL REUS W/ TWL LRG LVL3 (GOWN DISPOSABLE) ×3 IMPLANT
GOWN STRL REUS W/TWL LRG LVL3 (GOWN DISPOSABLE) ×9
HANDLE PNEUMATIC FOR CONSTEL (OPHTHALMIC) IMPLANT
KIT BASIN OR (CUSTOM PROCEDURE TRAY) ×3 IMPLANT
KNIFE CRESCENT 2.5 55 ANG (BLADE) IMPLANT
MICROPICK 25G (MISCELLANEOUS)
NDL 18GX1X1/2 (RX/OR ONLY) (NEEDLE) ×1 IMPLANT
NDL 25GX 5/8IN NON SAFETY (NEEDLE) IMPLANT
NDL FILTER BLUNT 18X1 1/2 (NEEDLE) ×1 IMPLANT
NDL HYPO 30X.5 LL (NEEDLE) IMPLANT
NEEDLE 18GX1X1/2 (RX/OR ONLY) (NEEDLE) ×6 IMPLANT
NEEDLE 25GX 5/8IN NON SAFETY (NEEDLE) IMPLANT
NEEDLE FILTER BLUNT 18X 1/2SAF (NEEDLE) ×2
NEEDLE FILTER BLUNT 18X1 1/2 (NEEDLE) ×1 IMPLANT
NEEDLE HYPO 30X.5 LL (NEEDLE) IMPLANT
NS IRRIG 1000ML POUR BTL (IV SOLUTION) ×3 IMPLANT
PACK VITRECTOMY CUSTOM (CUSTOM PROCEDURE TRAY) ×3 IMPLANT
PAD ARMBOARD 7.5X6 YLW CONV (MISCELLANEOUS) ×6 IMPLANT
PAK PIK VITRECTOMY CVS 25GA (OPHTHALMIC) ×3 IMPLANT
PENCIL BIPOLAR 25GA STR DISP (OPHTHALMIC RELATED) IMPLANT
PIC ILLUMINATED 25G (OPHTHALMIC) ×3
PICK MICROPICK 25G (MISCELLANEOUS) IMPLANT
PIK ILLUMINATED 25G (OPHTHALMIC) ×1 IMPLANT
PROBE LASER ILLUM FLEX CVD 25G (OPHTHALMIC) ×4 IMPLANT
REPL STRA BRUSH NDL (NEEDLE) IMPLANT
REPL STRA BRUSH NEEDLE (NEEDLE) IMPLANT
RESERVOIR BACK FLUSH (MISCELLANEOUS) IMPLANT
ROLLS DENTAL (MISCELLANEOUS) ×6 IMPLANT
SCISSORS TIP ADVANCED DSP 25GA (INSTRUMENTS) IMPLANT
SCRAPER DIAMOND 25GA (OPHTHALMIC RELATED) IMPLANT
SCRAPER DIAMOND DUST MEMBRANE (MISCELLANEOUS) IMPLANT
SPONGE SURGIFOAM ABS GEL 12-7 (HEMOSTASIS) ×3 IMPLANT
STOPCOCK 4 WAY LG BORE MALE ST (IV SETS) IMPLANT
SUT CHROMIC 7 0 TG140 8 (SUTURE) IMPLANT
SUT ETHILON 10 0 CS140 6 (SUTURE) IMPLANT
SUT ETHILON 9 0 TG140 8 (SUTURE) IMPLANT
SUT POLY NON ABSORB 10-0 8 STR (SUTURE) IMPLANT
SUT SILK 4 0 RB 1 (SUTURE) IMPLANT
SYR 20CC LL (SYRINGE) ×3 IMPLANT
SYR 5ML LL (SYRINGE) IMPLANT
SYR BULB 3OZ (MISCELLANEOUS) ×3 IMPLANT
SYR TB 1ML LUER SLIP (SYRINGE) ×3 IMPLANT
SYRINGE 10CC LL (SYRINGE) ×4 IMPLANT
TOWEL OR 17X24 6PK STRL BLUE (TOWEL DISPOSABLE) ×3 IMPLANT
TUBING HIGH PRESS EXTEN 6IN (TUBING) IMPLANT
WATER STERILE IRR 1000ML POUR (IV SOLUTION) ×3 IMPLANT
WIPE INSTRUMENT VISIWIPE 73X73 (MISCELLANEOUS) IMPLANT

## 2016-06-14 NOTE — Brief Op Note (Signed)
06/14/2016  1:00 PM  PATIENT:  Johnathan [name]  50 y.o. male  PRE-OPERATIVE DIAGNOSIS:  vitreous hemorrhage left eye  POST-OPERATIVE DIAGNOSIS:  vitreous hemorrhage left eye  PROCEDURE:  Procedure(s): PARS PLANA VITRECTOMY WITH 25 GAUGE with Laser and Gas injection. (Left)  SURGEON:  Surgeon(s) and Role:    * Sherrie GeorgeJohn D Ligia Duguay, MD - Primary  PHYSICIAN ASSISTANT:   Brief Operative note   Preoperative diagnosis:  vitreous hemorrhage left eye Postoperative diagnosis  Retinal detachment, vitreous membranes left eye  Procedures: Repair of retinal detachment with pars plana vitrectomy, membrane stripping, laser, gas injection left eye  Surgeon:  Sherrie GeorgeJohn D Xoe Hoe, MD...  Assistant:  Rosalie DoctorLisa Johnson SA    Anesthesia: General  Specimen: none  Estimated blood loss:  1cc  Complications: none  Patient sent to PACU in good condition  Composed by Sherrie GeorgeJohn D Abner Ardis MD  Dictation number: dictated twice, no number given

## 2016-06-14 NOTE — OR Nursing (Addendum)
C3F8 Gas injected into left eye by Dr. Alan MulderJohn [name]. OR#8 [facility]. Bracelet placed on patient's right wrist. Information card sent with the patient.

## 2016-06-14 NOTE — Anesthesia Preprocedure Evaluation (Addendum)
Anesthesia Evaluation  Patient identified by MRN, date of birth, ID band Patient awake    Reviewed: Allergy & Precautions, NPO status , Patient's Chart, lab work & pertinent test results  History of Anesthesia Complications (+) PONV  Airway Mallampati: II   Neck ROM: full    Dental  (+) Teeth Intact, Dental Advisory Given   Pulmonary    breath sounds clear to auscultation       Cardiovascular negative cardio ROS   Rhythm:regular Rate:Normal     Neuro/Psych    GI/Hepatic hiatal hernia,   Endo/Other    Renal/GU stones     Musculoskeletal   Abdominal   Peds  Hematology   Anesthesia Other Findings [place]  Reproductive/Obstetrics                            Anesthesia Physical Anesthesia Plan  ASA: II  Anesthesia Plan: General   Post-op Pain Management:    Induction: Intravenous  Airway Management Planned: Oral ETT  Additional Equipment:   Intra-op Plan:   Post-operative Plan: Extubation in OR  Informed Consent: I have reviewed the patients History and Physical, chart, labs and discussed the procedure including the risks, benefits and alternatives for the proposed anesthesia with the patient or authorized representative who has indicated his/her understanding and acceptance.     Plan Discussed with: CRNA, Anesthesiologist and Surgeon  Anesthesia Plan Comments:         Anesthesia Quick Evaluation

## 2016-06-14 NOTE — Transfer of Care (Signed)
Immediate Anesthesia Transfer of Care Note  Patient: Johnathan [name]  Procedure(s) Performed: Procedure(s): PARS PLANA VITRECTOMY WITH 25 GAUGE with Laser and Gas injection. (Left)  Patient Location: PACU  Anesthesia Type:General  Level of Consciousness: awake, sedated and patient cooperative  Airway & Oxygen Therapy: Patient Spontanous Breathing and Patient connected to nasal cannula oxygen  Post-op Assessment: Report given to RN, Post -op Vital signs reviewed and stable and Patient moving all extremities  Post vital signs: Reviewed and stable  Last Vitals:  Filed Vitals:   06/14/16 0850  BP: 145/88  Pulse: 57  Temp: 36.9 C  Resp: 20    Last Pain: There were no vitals filed for this visit.    Patients Stated Pain Goal: 2 (06/14/16 0906)  Complications: No apparent anesthesia complications

## 2016-06-14 NOTE — Anesthesia Procedure Notes (Signed)
Procedure Name: Intubation Date/Time: 06/14/2016 11:56 AM Performed by: Marni GriffonJAMES, Griselda Tosh B Pre-anesthesia Checklist: Patient identified, Emergency Drugs available, Suction available and Patient being monitored Patient Re-evaluated:Patient Re-evaluated prior to inductionOxygen Delivery Method: Circle system utilized Preoxygenation: Pre-oxygenation with 100% oxygen Intubation Type: IV induction Ventilation: Mask ventilation without difficulty Laryngoscope Size: Mac and 4 Grade View: Grade II Tube type: Oral Tube size: 7.5 mm Number of attempts: 1 Airway Equipment and Method: Stylet Placement Confirmation: ETT inserted through vocal cords under direct vision,  positive ETCO2 and breath sounds checked- equal and bilateral Secured at: 21 (cm at teeth) cm Tube secured with: Tape Dental Injury: Teeth and Oropharynx as per pre-operative assessment

## 2016-06-14 NOTE — H&P (Signed)
I examined the patient today and there is no change in the medical status 

## 2016-06-15 ENCOUNTER — Encounter (HOSPITAL_COMMUNITY): Payer: Self-pay | Admitting: Ophthalmology

## 2016-06-15 DIAGNOSIS — H33012 Retinal detachment with single break, left eye: Secondary | ICD-10-CM | POA: Diagnosis not present

## 2016-06-15 MED ORDER — BACITRACIN-POLYMYXIN B 500-10000 UNIT/GM OP OINT: 1.0000 "application " | TOPICAL_OINTMENT | Freq: Three times a day (TID) | OPHTHALMIC | Status: DC

## 2016-06-15 MED ORDER — GATIFLOXACIN 0.5 % OP SOLN: 1.0000 [drp] | Freq: Four times a day (QID) | OPHTHALMIC | Status: DC

## 2016-06-15 MED ORDER — PREDNISOLONE ACETATE 1 % OP SUSP: 1.0000 [drp] | Freq: Four times a day (QID) | OPHTHALMIC | Status: DC

## 2016-06-15 NOTE — Op Note (Signed)
NAMMarene [name]:  Vogler, [name]                ACCOUNT NO.:  [id number]  MEDICAL RECORD NO.:  [id number]  LOCATION:  [id number]                        FACILITY:  [facility]  PHYSICIAN:  Beulah GandyJohn D. Ashley RoyaltyMatthews, M.D. DATE OF BIRTH:  06/04/1966  DATE OF PROCEDURE:  06/14/2016 DATE OF DISCHARGE:                              OPERATIVE REPORT   ADMISSION DIAGNOSES:  Rhegmatogenous retinal detachment, left eye, anterior vitreous membranes, posterior vitreous membranes, vitreous hemorrhage in the left eye.  PROCEDURE:  Repair of complex retinal detachment with anterior membrane stripping, left eye; pars plana vitrectomy, retinal photocoagulation, gas fluid exchange, membrane peel, all in the left eye.  SURGEON:  Beulah GandyJohn D. Ashley RoyaltyMatthews, MD.  ASSISTANT:  Rosalie DoctorLisa Johnson, SA.  ANESTHESIA:  General.  DETAILS:  Usual prep and drape.  The 25-gauge trocars were placed at 10, 2, and 4 with infusion at 4 o'clock.  Provisc placed on the corneal surface.  Pars plana vitrectomy was begun just behind the crystalline lens.  Dense membranes with vitreous and retinal pigment were seen on the posterior aspect of the lens and out to the periphery.  The wide BIOM viewing system was moved into place to visualize this problem further.  A vitreous cutter was used to incise the membranes and strip them from their attachments in the retinal periphery and their attachment across the back of the crystalline lens.  The vitrectomy was carried posteriorly.  Retinal detachment was seen in the upper nasal quadrant.  The core vitrectomy was completed.  Then, the vitrectomy was carried into the mid periphery.  The vitrectomy was carried into the far periphery with low suction and rapid cutting.  All posterior membranes were stripped and removed.  Laser photocoagulation was performed internally treating questionable areas of retina for 360 degrees.  A total gas fluid exchange was carried out and the retina became totally flattened.  The endolaser was  performed over the area of reattached retina as well.  The power was 300 mW, 816 burns, 1000 microns each, and 0.1 seconds each.  C3F8 was then exchanged for intravitreal gas in an 8% concentration.  The instruments were removed from the eye.  The 25-gauge trocars were removed from the eye.  The wounds were tested and found to be secure.  Polymyxin and ceftazidime were injected around the globe for antibiotic coverage.  Decadron 10 mg was injected to the lower subconjunctival space.  Marcaine was injected around the globe for postop pain.  Closing pressure was 10 mmHg with a Barraquer tonometer. Polysporin ophthalmic ointment, a patch, and shield were placed.  The patient was awakened and taken to recovery in satisfactory condition.    Beulah GandyJohn D. Ashley RoyaltyMatthews, M.D.    JDM/MEDQ  D:  06/14/2016  T:  06/15/2016  Job:  [id number]

## 2016-06-15 NOTE — Progress Notes (Signed)
Discharge papers gone over with pt. Eye drops and other supplies already given to pt. By MD. IV already taken out by night nurse. Pt. States no questions/concerns. Pt. Refused w/c. Pt. D/c'd successfully.

## 2016-06-15 NOTE — Op Note (Signed)
[name], Johnathan [name]                ACCOUNT NO.:  [id number]  MEDICAL RECORD NO.:  [id number]  LOCATION:  6N28C                        FACILITY:  [facility]  PHYSICIAN:  Beulah GandyJohn D. Ashley RoyaltyMatthews, M.D. DATE OF BIRTH:  17-May-1966  DATE OF PROCEDURE:  06/14/2016 DATE OF DISCHARGE:                              OPERATIVE REPORT   ADMISSION DIAGNOSIS:  Rhegmatogenous retinal detachment with vitreous membranes and pigment.  PROCEDURES:  Repair of complex retinal detachment with pars plana vitrectomy, gas fluid exchange, membrane stripping at the lens, retinal photocoagulation C3F8 injection.  ASSISTANT:  Rosalie DoctorLisa Johnson, SA.  ANESTHESIA:  General.  DETAILS:  The 25-gauge trocars were placed at 10, 2, and 4 o'clock respectively.  Infusion at 4 o'clock.  The pars plana vitrectomy was begun just in the mid vitreous cavity in a core fashion.  The vitrectomy was carried anteriorly and pigmented membranes were seen stretching tightly across the posterior aspect of the crystalline lens.  There was dense pigment in these membranes.  These membranes were stripped with the vitreous cutter and vitreous suction and carefully removed from the posterior surface of the lens.  The vitrectomy was carried into the far periphery where the membranes extended and the membranes were removed with the vitreous cutter for 360 degrees.  The core vitrectomy was completed down to the macular surface, which was clear.  The detachment was seen in the upper nasal quadrant.  Additional vitrectomy was carried out for 360 degrees in the mid periphery.  A gas fluid exchange was carried out.  The endolaser was positioned in the eye, 816 burns were placed around the area of reattached retina and around the retinal periphery.  The power was 300 milliwatts, 1000 microns each, and 0.1 seconds each.  Once laser was complete, the C3F8 8% was exchanged for intravitreal gas.  The instruments were removed from the eye and the 25- gauge trocars  were removed.  The wounds were tested and found to be secure.  Polymyxin and ceftazidime were injected around the globe for antibiotic coverage.  Decadron 10 mg was injected into the lower subconjunctival space.  Marcaine was injected around the globe for postop pain.  Closing pressure was 10 with a Barraquer tonometer. Polysporin ophthalmic ointment, a patch, and shield were placed.  The patient was awakened, and taken to recovery in satisfactory condition.  COMPLICATIONS:  None.  DURATION:  One and a half hours.    Beulah GandyJohn D. Ashley RoyaltyMatthews, M.D.    JDM/MEDQ  D:  06/14/2016  T:  06/15/2016  Job:  [id number]

## 2016-06-15 NOTE — Progress Notes (Signed)
06/15/2016, 6:22 AM  Mental Status:  Awake, Alert, Oriented  Anterior segment: Cornea  Clear    Anterior Chamber Clear    Lens:   Clear    Conjunctival hemorrhage   Intra Ocular Pressure 11 mmHg with Tonopen  Vitreous: Clear 90%gas bubble   Retina:  Attached Good laser reaction   Impression: Excellent result Retina attached   Final Diagnosis: Active Problems:   Rhegmatogenous retinal detachment of left eye   Plan: start post operative eye drops.  Discharge to home.  Give post operative instructions  [name], Johnathan [name] D 06/15/2016, 6:22 AM

## 2016-06-15 NOTE — Discharge Summary (Signed)
Discharge summary not needed on [facility] patients per medical records. 

## 2016-06-16 NOTE — Anesthesia Postprocedure Evaluation (Signed)
Anesthesia Post Note  Patient: Johnathan [name]  Procedure(s) Performed: Procedure(s) (LRB): PARS PLANA VITRECTOMY WITH 25 GAUGE with Laser and Gas injection. (Left)  Patient location during evaluation: PACU Anesthesia Type: General Level of consciousness: awake and alert and patient cooperative Pain management: pain level controlled Vital Signs Assessment: post-procedure vital signs reviewed and stable Respiratory status: spontaneous breathing and respiratory function stable Cardiovascular status: stable Anesthetic complications: no    Last Vitals:  Filed Vitals:   06/14/16 2142 06/15/16 0514  BP: 100/55 111/53  Pulse:  51  Temp:  36.6 C  Resp:  15    Last Pain:  Filed Vitals:   06/15/16 1116  PainSc: 0-No pain                 Margaretta Chittum S

## 2016-06-21 ENCOUNTER — Encounter (INDEPENDENT_AMBULATORY_CARE_PROVIDER_SITE_OTHER): Payer: BLUE CROSS/BLUE SHIELD | Admitting: Ophthalmology

## 2016-06-21 DIAGNOSIS — H4312 Vitreous hemorrhage, left eye: Secondary | ICD-10-CM

## 2016-06-22 ENCOUNTER — Encounter (INDEPENDENT_AMBULATORY_CARE_PROVIDER_SITE_OTHER): Payer: BLUE CROSS/BLUE SHIELD | Admitting: Ophthalmology

## 2016-06-22 DIAGNOSIS — H4312 Vitreous hemorrhage, left eye: Secondary | ICD-10-CM

## 2016-06-30 ENCOUNTER — Encounter (INDEPENDENT_AMBULATORY_CARE_PROVIDER_SITE_OTHER): Payer: BLUE CROSS/BLUE SHIELD | Admitting: Ophthalmology

## 2016-06-30 DIAGNOSIS — H4312 Vitreous hemorrhage, left eye: Secondary | ICD-10-CM

## 2016-07-05 ENCOUNTER — Telehealth: Payer: Self-pay | Admitting: Family Medicine

## 2016-07-05 NOTE — Telephone Encounter (Signed)
Patient's family,Amy and Wilber Oliphant, sees Dr.Duncan. Patient was a patient of Dr.Schaller's.  Patient never came to see Dr.Duncan.  Patient is out of work for the next 2 weeks and wants to know if Dr.Duncan can see him for a physical.  Patient said he hasn't had a physical since he was 84. Dr.Duncan's first available for a new patient is 12/06/16.  Please advise.

## 2016-07-06 NOTE — Telephone Encounter (Signed)
Patient scheduled appointment on 07/14/16 at 11:30.  Patient said it's just for a physical.

## 2016-07-06 NOTE — Telephone Encounter (Signed)
 OV when possible, come in fasting for 6-8 hours  If AM appointment, don't eat breakfast.   If PM appointment, then eat breakfast but skip lunch.  If only needing CPE, then okay.  If other issues, then we may have to do those instead of the CPE at the OV.  Thanks.

## 2016-07-14 ENCOUNTER — Encounter: Payer: Self-pay | Admitting: Family Medicine

## 2016-07-14 ENCOUNTER — Ambulatory Visit (INDEPENDENT_AMBULATORY_CARE_PROVIDER_SITE_OTHER): Payer: BLUE CROSS/BLUE SHIELD | Admitting: Family Medicine

## 2016-07-14 VITALS — BP 110/80 | HR 67 | Ht 65.0 in | Wt 206.8 lb

## 2016-07-14 DIAGNOSIS — Z23 Encounter for immunization: Secondary | ICD-10-CM

## 2016-07-14 DIAGNOSIS — Z0000 Encounter for general adult medical examination without abnormal findings: Secondary | ICD-10-CM | POA: Diagnosis not present

## 2016-07-14 DIAGNOSIS — Z8639 Personal history of other endocrine, nutritional and metabolic disease: Secondary | ICD-10-CM

## 2016-07-14 DIAGNOSIS — Z7189 Other specified counseling: Secondary | ICD-10-CM

## 2016-07-14 LAB — LIPID PANEL
CHOL/HDL RATIO: 5
CHOLESTEROL: 236 mg/dL — AB (ref 0–200)
HDL: 46.9 mg/dL (ref >39.00)
LDL CALC: 152 mg/dL — AB (ref 0–99)
NonHDL: 189.34
TRIGLYCERIDES: 188 mg/dL — AB (ref 0.0–149.0)
VLDL: 37.6 mg/dL (ref 0.0–40.0)

## 2016-07-14 LAB — GLUCOSE, RANDOM: Glucose, Bld: 96 mg/dL (ref 70–99)

## 2016-07-14 NOTE — Assessment & Plan Note (Signed)
Tetanus 2017.  Flu encouraged.  PNA not due.  Shingles not due.   Colon cancer screening not due.  D/w pt.  PSA screening not due.   Living will d/w pt.  Wife designated if patient were incapacitated.  Weight up in the meantime, weight gain in the last few months per patient report.  D/w pt about diet and exercise after his eye issues are resolved.   H/o HLD.  Due for labs.  Not on meds.   Pt declined HIV screening.  D/w pt re: routine screening.  His L eye vision is better in the meantime.  He has eye clinic f/u pending.

## 2016-07-14 NOTE — Progress Notes (Signed)
CPE- See plan.  Routine anticipatory guidance given to patient.  See health maintenance. Tetanus today.  Flu encouraged.  PNA not due.  Shingles not due.   Colon cancer screening not due.  D/w pt.  PSA screening not due.   Living will d/w pt.  Wife designated if patient were incapacitated.  Weight up in the meantime, weight gain in the last few months per patient report.  D/w pt about diet and exercise after his eye issues are resolved.   H/o HLD.  Due for labs.  Not on meds.   Pt declined HIV screening.  D/w pt re: routine screening.  His L eye vision is better in the meantime.  He has eye clinic f/u pending.  Prev mild elevation in sugar noted.  D/w pt.    PMH and SH reviewed  Meds, vitals, and allergies reviewed.   ROS: Per HPI.  Unless specifically indicated otherwise in HPI, the patient denies:  General: fever. Eyes: acute vision changes except for as above with L eye changes.   ENT: sore throat Cardiovascular: chest pain Respiratory: SOB GI: vomiting GU: dysuria Musculoskeletal: acute back pain Derm: acute rash Neuro: acute motor dysfunction Psych: worsening mood Endocrine: polydipsia Heme: bleeding Allergy: hayfever  GEN: nad, alert and oriented HEENT: mucous membranes moist NECK: supple w/o LA CV: rrr. PULM: ctab, no inc wob ABD: soft, +bs EXT: no edema SKIN: no acute rash. Mult benign appearing nevi on the trunk.  1 darker but uniform 3mm nevus on the L lower posterior leg- present for long duration per patient report.

## 2016-07-14 NOTE — Patient Instructions (Signed)
Go to the lab on the way out.  We'll contact you with your lab report. Take care.  Glad to see you.  Update me as needed.   

## 2016-07-14 NOTE — Assessment & Plan Note (Signed)
Living will d/w pt.  Wife designated if patient were incapacitated.   ?

## 2016-07-18 ENCOUNTER — Encounter (INDEPENDENT_AMBULATORY_CARE_PROVIDER_SITE_OTHER): Payer: BLUE CROSS/BLUE SHIELD | Admitting: Ophthalmology

## 2016-07-18 DIAGNOSIS — H4312 Vitreous hemorrhage, left eye: Secondary | ICD-10-CM

## 2016-09-26 ENCOUNTER — Encounter (INDEPENDENT_AMBULATORY_CARE_PROVIDER_SITE_OTHER): Payer: BLUE CROSS/BLUE SHIELD | Admitting: Ophthalmology

## 2016-09-26 DIAGNOSIS — H43811 Vitreous degeneration, right eye: Secondary | ICD-10-CM

## 2016-09-26 DIAGNOSIS — H338 Other retinal detachments: Secondary | ICD-10-CM | POA: Diagnosis not present

## 2016-09-26 DIAGNOSIS — H2513 Age-related nuclear cataract, bilateral: Secondary | ICD-10-CM | POA: Diagnosis not present

## 2016-10-06 ENCOUNTER — Ambulatory Visit (INDEPENDENT_AMBULATORY_CARE_PROVIDER_SITE_OTHER): Payer: BLUE CROSS/BLUE SHIELD | Admitting: Ophthalmology

## 2016-10-06 DIAGNOSIS — H338 Other retinal detachments: Secondary | ICD-10-CM

## 2016-10-06 NOTE — H&P (Signed)
Johnathan [name] is an 50 y.o. male.   Chief Complaint: loss of vision left eye HPI: Has pars plana vitrectomy left eye for retinal detachment and hemorrhage on 06-14-16.  When gas bubble resolved, retinal detachment came back.    Past Medical History:  Diagnosis Date  . Family history of adverse reaction to anesthesia   . History of gout    "feet"  . History of hiatal hernia   . Kidney stones   . Laceration 1985   left forearm arterial laceration  . Pneumonia 2017    Past Surgical History:  Procedure Laterality Date  . 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 EYE SURGERY    . KIDNEY STONE SURGERY    . LITHOTRIPSY    . 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   with gas, cryo and bubble   . PARS PLANA VITRECTOMY Left 06/14/2016   Procedure: PARS PLANA VITRECTOMY WITH 25 GAUGE with Laser and Gas injection.;  Surgeon: Sherrie GeorgeJohn D Matthews, MD;  [facility];  Service: Ophthalmology;  Laterality: Left;  Marland Kitchen. VARICOSE VEIN SURGERY Left 1990's   thigh, Dr.Humphreys    Family History  Problem Relation Age of Onset  . Dementia Mother   . Diabetes Mother   . Heart disease Father   . Depression Father     manic  . Hypertension Father   . Alzheimer's disease Father   . Colon cancer Neg Hx   . Prostate cancer Neg Hx    Social History:  reports that he has never smoked. He has never used smokeless tobacco. He reports that he does not drink alcohol or use drugs.  Allergies:  Allergies  Allergen Reactions  . Zofran [Ondansetron Hcl] Nausea And Vomiting    Did not help  . Penicillins Hives and Rash    Has patient had a PCN reaction causing immediate rash, facial/tongue/throat swelling, SOB or lightheadedness with hypotension: Yes Has patient had a PCN reaction causing severe rash involving mucus membranes or skin necrosis: No Has patient had a PCN reaction that required hospitalization No Has patient had a PCN reaction occurring  within the last 10 years: No If all of the above answers are "NO", then may proceed with Cephalosporin use.     No prescriptions prior to admission.    Review of systems otherwise negative  There were no vitals taken for this visit.  Physical exam: Mental status: oriented x3. Eyes: See eye exam associated with this date of surgery in media tab.  Scanned in by scanning center Ears, Nose, Throat: within normal limits Neck: Within Normal limits General: within normal limits Chest: Within normal limits Breast: deferred Heart: Within normal limits Abdomen: Within normal limits GU: deferred Extremities: within normal limits Skin: within normal limits  Assessment/Plan Rhegmatogenous retinal detachment left eye [facility] with gas injection and laser treatment left eye.  [name], JOHN D 10/06/2016, 5:06 PM

## 2016-10-19 ENCOUNTER — Encounter (INDEPENDENT_AMBULATORY_CARE_PROVIDER_SITE_OTHER): Payer: BLUE CROSS/BLUE SHIELD | Admitting: Ophthalmology

## 2016-10-24 ENCOUNTER — Encounter (HOSPITAL_COMMUNITY): Payer: Self-pay | Admitting: *Deleted

## 2016-10-24 NOTE — Progress Notes (Signed)
Pt denies SOB, chest pain, and being under the care of a cardiologist. Pt denies having a stress test, echo and cardiac cath. Pt denies having a chest x ray within the last year. Pt denies having any recent labs. Pt made aware to stop taking vitamins, fish oil, and herbal medications. Do not take any NSAIDs ie: Ibuprofen, Advil, Naproxen, BC and Goody Powder. Pt verbalized understanding of all pre-op instructions.

## 2016-10-25 ENCOUNTER — Ambulatory Visit (HOSPITAL_COMMUNITY): Payer: BLUE CROSS/BLUE SHIELD | Admitting: Anesthesiology

## 2016-10-25 ENCOUNTER — Encounter (HOSPITAL_COMMUNITY): Payer: Self-pay | Admitting: Surgery

## 2016-10-25 ENCOUNTER — Ambulatory Visit (HOSPITAL_COMMUNITY)
Admission: RE | Admit: 2016-10-25 | Discharge: 2016-10-26 | Disposition: A | Discharge status: Home / Self Care | Payer: BLUE CROSS/BLUE SHIELD | Source: Ambulatory Visit | Attending: Ophthalmology | Admitting: Ophthalmology

## 2016-10-25 ENCOUNTER — Encounter (HOSPITAL_COMMUNITY)
Admission: RE | Disposition: A | Discharge status: Home / Self Care | Payer: Self-pay | Source: Ambulatory Visit | Attending: Ophthalmology

## 2016-10-25 DIAGNOSIS — Z8489 Family history of other specified conditions: Secondary | ICD-10-CM | POA: Insufficient documentation

## 2016-10-25 DIAGNOSIS — Z818 Family history of other mental and behavioral disorders: Secondary | ICD-10-CM | POA: Diagnosis not present

## 2016-10-25 DIAGNOSIS — Z888 Allergy status to other drugs, medicaments and biological substances status: Secondary | ICD-10-CM | POA: Insufficient documentation

## 2016-10-25 DIAGNOSIS — H33002 Unspecified retinal detachment with retinal break, left eye: Secondary | ICD-10-CM | POA: Diagnosis present

## 2016-10-25 DIAGNOSIS — H33012 Retinal detachment with single break, left eye: Secondary | ICD-10-CM | POA: Diagnosis not present

## 2016-10-25 DIAGNOSIS — M109 Gout, unspecified: Secondary | ICD-10-CM | POA: Insufficient documentation

## 2016-10-25 DIAGNOSIS — Z8249 Family history of ischemic heart disease and other diseases of the circulatory system: Secondary | ICD-10-CM | POA: Insufficient documentation

## 2016-10-25 DIAGNOSIS — Z880 Allergy status to penicillin: Secondary | ICD-10-CM | POA: Insufficient documentation

## 2016-10-25 DIAGNOSIS — Z833 Family history of diabetes mellitus: Secondary | ICD-10-CM | POA: Insufficient documentation

## 2016-10-25 HISTORY — PX: SCLERAL BUCKLE WITH CRYO: SHX5341

## 2016-10-25 HISTORY — PX: PHOTOCOAGULATION WITH LASER: SHX6027

## 2016-10-25 HISTORY — PX: GAS/FLUID EXCHANGE: SHX5334

## 2016-10-25 LAB — COMPREHENSIVE METABOLIC PANEL
ALK PHOS: 40 U/L (ref 38–126)
ALT: 39 U/L (ref 17–63)
ANION GAP: 6 (ref 5–15)
AST: 29 U/L (ref 15–41)
Albumin: 4 g/dL (ref 3.5–5.0)
BUN: 17 mg/dL (ref 6–20)
CALCIUM: 9.2 mg/dL (ref 8.9–10.3)
CO2: 25 mmol/L (ref 22–32)
Chloride: 109 mmol/L (ref 101–111)
Creatinine, Ser: 0.7 mg/dL (ref 0.61–1.24)
GFR calc non Af Amer: >60 mL/min (ref >60)
Glucose, Bld: 99 mg/dL (ref 65–99)
POTASSIUM: 3.8 mmol/L (ref 3.5–5.1)
SODIUM: 140 mmol/L (ref 135–145)
Total Bilirubin: 0.5 mg/dL (ref 0.3–1.2)
Total Protein: 6.4 g/dL — ABNORMAL LOW (ref 6.5–8.1)

## 2016-10-25 LAB — CBC
HCT: 44.2 % (ref 39.0–52.0)
HEMOGLOBIN: 15.5 g/dL (ref 13.0–17.0)
MCH: 31 pg (ref 26.0–34.0)
MCHC: 35.1 g/dL (ref 30.0–36.0)
MCV: 88.4 fL (ref 78.0–100.0)
PLATELETS: 238 10*3/uL (ref 150–400)
RBC: 5 MIL/uL (ref 4.22–5.81)
RDW: 12.2 % (ref 11.5–15.5)
WBC: 6.5 10*3/uL (ref 4.0–10.5)

## 2016-10-25 SURGERY — SCLERAL BUCKLE WITH CRYO
Anesthesia: General | Site: Eye | Laterality: Left

## 2016-10-25 MED ORDER — CYCLOPENTOLATE HCL 1 % OP SOLN
1.0000 [drp] | OPHTHALMIC | Status: DC | PRN
  Filled 2016-10-25: qty 2

## 2016-10-25 MED ORDER — OXYCODONE HCL 5 MG/5ML PO SOLN: 5.0000 mg | Freq: Once | ORAL | Status: DC | PRN

## 2016-10-25 MED ORDER — FENTANYL CITRATE (PF) 100 MCG/2ML IJ SOLN
INTRAMUSCULAR | Status: DC | PRN
  Administered 2016-10-25: 100 ug via INTRAVENOUS
  Administered 2016-10-25 (×2): 50 ug via INTRAVENOUS

## 2016-10-25 MED ORDER — DEXAMETHASONE SODIUM PHOSPHATE 10 MG/ML IJ SOLN
INTRAMUSCULAR | Status: AC
  Filled 2016-10-25: qty 1

## 2016-10-25 MED ORDER — ROCURONIUM BROMIDE 100 MG/10ML IV SOLN
INTRAVENOUS | Status: DC | PRN
  Administered 2016-10-25: 10 mg via INTRAVENOUS
  Administered 2016-10-25: 50 mg via INTRAVENOUS
  Administered 2016-10-25 (×2): 10 mg via INTRAVENOUS

## 2016-10-25 MED ORDER — GATIFLOXACIN 0.5 % OP SOLN
1.0000 [drp] | OPHTHALMIC | Status: AC | PRN
  Administered 2016-10-25 (×3): 1 [drp] via OPHTHALMIC

## 2016-10-25 MED ORDER — BRIMONIDINE TARTRATE 0.2 % OP SOLN
1.0000 [drp] | Freq: Two times a day (BID) | OPHTHALMIC | Status: DC
  Filled 2016-10-25: qty 5

## 2016-10-25 MED ORDER — ATROPINE SULFATE 1 % OP SOLN
OPHTHALMIC | Status: DC | PRN
  Administered 2016-10-25: 1 [drp] via OPHTHALMIC

## 2016-10-25 MED ORDER — TETRACAINE HCL 0.5 % OP SOLN
2.0000 [drp] | Freq: Once | OPHTHALMIC | Status: DC
  Filled 2016-10-25: qty 2

## 2016-10-25 MED ORDER — SODIUM CHLORIDE 0.9 % IJ SOLN
INTRAMUSCULAR | Status: AC
Start: 2016-10-25 — End: 2016-10-25
  Filled 2016-10-25: qty 10

## 2016-10-25 MED ORDER — SODIUM CHLORIDE 0.45 % IV SOLN
INTRAVENOUS | Status: DC
  Administered 2016-10-25: 20:00:00 via INTRAVENOUS

## 2016-10-25 MED ORDER — IBUPROFEN 600 MG PO TABS: 600.0000 mg | ORAL_TABLET | Freq: Four times a day (QID) | ORAL | Status: DC | PRN

## 2016-10-25 MED ORDER — HYPROMELLOSE (GONIOSCOPIC) 2.5 % OP SOLN
OPHTHALMIC | Status: AC
  Filled 2016-10-25: qty 15

## 2016-10-25 MED ORDER — GATIFLOXACIN 0.5 % OP SOLN
1.0000 [drp] | OPHTHALMIC | Status: DC | PRN
  Filled 2016-10-25: qty 2.5

## 2016-10-25 MED ORDER — BSS PLUS IO SOLN
INTRAOCULAR | Status: AC
  Filled 2016-10-25: qty 500

## 2016-10-25 MED ORDER — ONDANSETRON HCL 4 MG/2ML IJ SOLN: 4.0000 mg | Freq: Once | INTRAMUSCULAR | Status: DC | PRN

## 2016-10-25 MED ORDER — SODIUM HYALURONATE 10 MG/ML IO SOLN
INTRAOCULAR | Status: AC
  Filled 2016-10-25: qty 0.85

## 2016-10-25 MED ORDER — TEMAZEPAM 15 MG PO CAPS: 15.0000 mg | ORAL_CAPSULE | Freq: Every evening | ORAL | Status: DC | PRN

## 2016-10-25 MED ORDER — BUPIVACAINE HCL (PF) 0.75 % IJ SOLN
INTRAMUSCULAR | Status: AC
Start: 2016-10-25 — End: 2016-10-25
  Filled 2016-10-25: qty 10

## 2016-10-25 MED ORDER — HYDROCODONE-ACETAMINOPHEN 5-325 MG PO TABS
1.0000 | ORAL_TABLET | ORAL | Status: DC | PRN
  Administered 2016-10-26 (×2): 2 via ORAL
  Filled 2016-10-25 (×2): qty 2

## 2016-10-25 MED ORDER — TROPICAMIDE 1 % OP SOLN
1.0000 [drp] | OPHTHALMIC | Status: AC | PRN
  Administered 2016-10-25 (×3): 1 [drp] via OPHTHALMIC

## 2016-10-25 MED ORDER — TROPICAMIDE 1 % OP SOLN
1.0000 [drp] | OPHTHALMIC | Status: DC | PRN
  Filled 2016-10-25: qty 15

## 2016-10-25 MED ORDER — LATANOPROST 0.005 % OP SOLN
1.0000 [drp] | Freq: Every day | OPHTHALMIC | Status: DC
  Filled 2016-10-25: qty 2.5

## 2016-10-25 MED ORDER — CLINDAMYCIN PHOSPHATE 600 MG/50ML IV SOLN
INTRAVENOUS | Status: DC | PRN
  Administered 2016-10-25: 600 mg via INTRAVENOUS

## 2016-10-25 MED ORDER — TRIAMCINOLONE ACETONIDE 40 MG/ML IJ SUSP
INTRAMUSCULAR | Status: AC
  Filled 2016-10-25: qty 5

## 2016-10-25 MED ORDER — CYCLOPENTOLATE HCL 1 % OP SOLN
1.0000 [drp] | OPHTHALMIC | Status: AC | PRN
  Administered 2016-10-25 (×3): 1 [drp] via OPHTHALMIC

## 2016-10-25 MED ORDER — GLYCOPYRROLATE 0.2 MG/ML IJ SOLN
INTRAMUSCULAR | Status: DC | PRN
  Administered 2016-10-25 (×3): 0.2 mg via INTRAVENOUS

## 2016-10-25 MED ORDER — STERILE WATER FOR INJECTION IJ SOLN
INTRAMUSCULAR | Status: DC | PRN
  Administered 2016-10-25: 10 mL

## 2016-10-25 MED ORDER — SCOPOLAMINE 1 MG/3DAYS TD PT72
1.0000 | MEDICATED_PATCH | TRANSDERMAL | Status: DC
  Administered 2016-10-25: 1.5 mg via TRANSDERMAL

## 2016-10-25 MED ORDER — SCOPOLAMINE 1 MG/3DAYS TD PT72
MEDICATED_PATCH | TRANSDERMAL | Status: AC
  Filled 2016-10-25: qty 1

## 2016-10-25 MED ORDER — DORZOLAMIDE HCL 2 % OP SOLN
1.0000 [drp] | Freq: Three times a day (TID) | OPHTHALMIC | Status: DC
  Filled 2016-10-25: qty 10

## 2016-10-25 MED ORDER — BSS IO SOLN
INTRAOCULAR | Status: AC
  Filled 2016-10-25: qty 15

## 2016-10-25 MED ORDER — MAGNESIUM HYDROXIDE 400 MG/5ML PO SUSP: 15.0000 mL | Freq: Four times a day (QID) | ORAL | Status: DC | PRN

## 2016-10-25 MED ORDER — ACETAMINOPHEN 325 MG PO TABS: 325.0000 mg | ORAL_TABLET | ORAL | Status: DC | PRN

## 2016-10-25 MED ORDER — ACETAZOLAMIDE SODIUM 500 MG IJ SOLR
INTRAMUSCULAR | Status: AC
  Filled 2016-10-25: qty 500

## 2016-10-25 MED ORDER — LIDOCAINE HCL 2 % IJ SOLN
INTRAMUSCULAR | Status: AC
  Filled 2016-10-25: qty 20

## 2016-10-25 MED ORDER — CEFTAZIDIME 1 G IJ SOLR
INTRAMUSCULAR | Status: AC
  Filled 2016-10-25: qty 1

## 2016-10-25 MED ORDER — GATIFLOXACIN 0.5 % OP SOLN
1.0000 [drp] | Freq: Four times a day (QID) | OPHTHALMIC | Status: DC
  Filled 2016-10-25: qty 2.5

## 2016-10-25 MED ORDER — MORPHINE SULFATE (PF) 2 MG/ML IV SOLN
1.0000 mg | INTRAVENOUS | Status: DC | PRN
  Administered 2016-10-25: 2 mg via INTRAVENOUS
  Filled 2016-10-25 (×2): qty 1

## 2016-10-25 MED ORDER — PHENYLEPHRINE HCL 2.5 % OP SOLN
1.0000 [drp] | OPHTHALMIC | Status: DC | PRN
  Filled 2016-10-25: qty 2

## 2016-10-25 MED ORDER — GENTAMICIN SULFATE 40 MG/ML IJ SOLN
INTRAMUSCULAR | Status: AC
Start: 2016-10-25 — End: 2016-10-25
  Filled 2016-10-25: qty 2

## 2016-10-25 MED ORDER — SUGAMMADEX SODIUM 500 MG/5ML IV SOLN
INTRAVENOUS | Status: DC | PRN
  Administered 2016-10-25: 300 mg via INTRAVENOUS

## 2016-10-25 MED ORDER — SODIUM CHLORIDE 0.9 % IV SOLN
INTRAVENOUS | Status: DC
  Administered 2016-10-25 (×2): via INTRAVENOUS

## 2016-10-25 MED ORDER — PROPOFOL 10 MG/ML IV BOLUS
INTRAVENOUS | Status: DC | PRN
  Administered 2016-10-25: 180 mg via INTRAVENOUS

## 2016-10-25 MED ORDER — STERILE WATER FOR INJECTION IJ SOLN
INTRAMUSCULAR | Status: AC
Start: 2016-10-25 — End: 2016-10-25
  Filled 2016-10-25: qty 20

## 2016-10-25 MED ORDER — BACITRACIN-POLYMYXIN B 500-10000 UNIT/GM OP OINT
TOPICAL_OINTMENT | OPHTHALMIC | Status: DC | PRN
  Administered 2016-10-25: 1 via OPHTHALMIC

## 2016-10-25 MED ORDER — FENTANYL CITRATE (PF) 100 MCG/2ML IJ SOLN
INTRAMUSCULAR | Status: AC
  Filled 2016-10-25: qty 2

## 2016-10-25 MED ORDER — BACITRACIN-POLYMYXIN B 500-10000 UNIT/GM OP OINT
1.0000 "application " | TOPICAL_OINTMENT | Freq: Three times a day (TID) | OPHTHALMIC | Status: DC
  Filled 2016-10-25: qty 3.5

## 2016-10-25 MED ORDER — EPINEPHRINE PF 1 MG/ML IJ SOLN
INTRAMUSCULAR | Status: AC
  Filled 2016-10-25: qty 1

## 2016-10-25 MED ORDER — PROPOFOL 10 MG/ML IV BOLUS
INTRAVENOUS | Status: AC
  Filled 2016-10-25: qty 20

## 2016-10-25 MED ORDER — ACETAZOLAMIDE SODIUM 500 MG IJ SOLR
500.0000 mg | Freq: Once | INTRAMUSCULAR | Status: AC
  Administered 2016-10-26: 500 mg via INTRAVENOUS
  Filled 2016-10-25: qty 500

## 2016-10-25 MED ORDER — PREDNISOLONE ACETATE 1 % OP SUSP
1.0000 [drp] | Freq: Four times a day (QID) | OPHTHALMIC | Status: DC
  Filled 2016-10-25: qty 5

## 2016-10-25 MED ORDER — BSS IO SOLN
INTRAOCULAR | Status: DC | PRN
  Administered 2016-10-25: 30 mL via INTRAOCULAR

## 2016-10-25 MED ORDER — POLYMYXIN B SULFATE 500000 UNITS IJ SOLR
INTRAMUSCULAR | Status: AC
Start: 2016-10-25 — End: 2016-10-25
  Filled 2016-10-25: qty 500000

## 2016-10-25 MED ORDER — ONDANSETRON HCL 4 MG/2ML IJ SOLN: 4.0000 mg | Freq: Four times a day (QID) | INTRAMUSCULAR | Status: DC

## 2016-10-25 MED ORDER — GENTAMICIN SULFATE 40 MG/ML IJ SOLN
INTRAMUSCULAR | Status: DC | PRN
  Administered 2016-10-25: 80 mg

## 2016-10-25 MED ORDER — OXYCODONE HCL 5 MG PO TABS: 5.0000 mg | ORAL_TABLET | Freq: Once | ORAL | Status: DC | PRN

## 2016-10-25 MED ORDER — ATROPINE SULFATE 1 % OP SOLN
OPHTHALMIC | Status: AC
Start: 2016-10-25 — End: 2016-10-25
  Filled 2016-10-25: qty 5

## 2016-10-25 MED ORDER — BUPIVACAINE HCL (PF) 0.75 % IJ SOLN
INTRAMUSCULAR | Status: DC | PRN
  Administered 2016-10-25: 10 mL

## 2016-10-25 MED ORDER — PHENYLEPHRINE HCL 2.5 % OP SOLN
1.0000 [drp] | OPHTHALMIC | Status: AC | PRN
  Administered 2016-10-25 (×3): 1 [drp] via OPHTHALMIC

## 2016-10-25 MED ORDER — LIDOCAINE HCL (CARDIAC) 20 MG/ML IV SOLN
INTRAVENOUS | Status: DC | PRN
  Administered 2016-10-25: 50 mg via INTRAVENOUS

## 2016-10-25 MED ORDER — DEXAMETHASONE SODIUM PHOSPHATE 10 MG/ML IJ SOLN
INTRAMUSCULAR | Status: DC | PRN
  Administered 2016-10-25: 10 mg

## 2016-10-25 MED ORDER — MIDAZOLAM HCL 2 MG/2ML IJ SOLN
INTRAMUSCULAR | Status: DC | PRN
  Administered 2016-10-25: 2 mg via INTRAVENOUS

## 2016-10-25 MED ORDER — FENTANYL CITRATE (PF) 100 MCG/2ML IJ SOLN: 25.0000 ug | INTRAMUSCULAR | Status: DC | PRN

## 2016-10-25 MED ORDER — BACITRACIN-POLYMYXIN B 500-10000 UNIT/GM OP OINT
TOPICAL_OINTMENT | OPHTHALMIC | Status: AC
  Filled 2016-10-25: qty 3.5

## 2016-10-25 MED ORDER — MIDAZOLAM HCL 2 MG/2ML IJ SOLN
INTRAMUSCULAR | Status: AC
  Filled 2016-10-25: qty 2

## 2016-10-25 SURGICAL SUPPLY — 81 items
APL SRG 3 HI ABS STRL LF PLS (MISCELLANEOUS) ×12
APPLICATOR DR MATTHEWS STRL (MISCELLANEOUS) ×18 IMPLANT
BALL CTTN LRG ABS STRL LF (GAUZE/BANDAGES/DRESSINGS) ×6
BAND CIRCLING RETINAL 2.5 (Ophthalmic Related) IMPLANT
BAND RTNL 125X2.5X.6 CRC (Ophthalmic Related) ×2 IMPLANT
BLADE EYE CATARACT 19 1.4 BEAV (BLADE) ×2 IMPLANT
BLADE MVR KNIFE 19G (BLADE) IMPLANT
CANNULA ANT CHAM MAIN (OPHTHALMIC RELATED) IMPLANT
CANNULA DUAL BORE 23G (CANNULA) IMPLANT
CORDS BIPOLAR (ELECTRODE) IMPLANT
COTTONBALL LRG STERILE PKG (GAUZE/BANDAGES/DRESSINGS) ×9 IMPLANT
COVER MAYO STAND STRL (DRAPES) ×2 IMPLANT
COVER SURGICAL LIGHT HANDLE (MISCELLANEOUS) ×3 IMPLANT
DRAPE INCISE 51X51 W/FILM STRL (DRAPES) ×3 IMPLANT
DRAPE OPHTHALMIC 77X100 STRL (CUSTOM PROCEDURE TRAY) ×3 IMPLANT
ERASER HMR WETFIELD 23G BP (MISCELLANEOUS) IMPLANT
FILTER BLUE MILLIPORE (MISCELLANEOUS) ×6 IMPLANT
FILTER STRAW FLUID ASPIR (MISCELLANEOUS) ×2 IMPLANT
FORCEPS GRIESHABER ILM 25G A (INSTRUMENTS) IMPLANT
GAS AUTO FILL CONSTEL (OPHTHALMIC)
GAS AUTO FILL CONSTELLATION (OPHTHALMIC) IMPLANT
GAS OPHTHALMIC (MISCELLANEOUS) ×2 IMPLANT
GLOVE SS BIOGEL STRL SZ 6.5 (GLOVE) ×3 IMPLANT
GLOVE SS BIOGEL STRL SZ 7 (GLOVE) ×2 IMPLANT
GLOVE SUPERSENSE BIOGEL SZ 6.5 (GLOVE) ×2
GLOVE SUPERSENSE BIOGEL SZ 7 (GLOVE) ×1
GLOVE SURG 8.5 LATEX PF (GLOVE) ×3 IMPLANT
GLOVE SURG SS PI 6.5 STRL IVOR (GLOVE) ×2 IMPLANT
GOWN STRL REUS W/ TWL LRG LVL3 (GOWN DISPOSABLE) ×6 IMPLANT
GOWN STRL REUS W/TWL LRG LVL3 (GOWN DISPOSABLE) ×12
HANDLE PNEUMATIC FOR CONSTEL (OPHTHALMIC) IMPLANT
IMPL SILICONE (Ophthalmic Related) ×2 IMPLANT
IMPLANT SILICONE (Ophthalmic Related) ×6 IMPLANT
KIT BASIN OR (CUSTOM PROCEDURE TRAY) ×3 IMPLANT
KIT PERFLUORON PROCEDURE 5ML (MISCELLANEOUS) IMPLANT
KIT ROOM TURNOVER OR (KITS) ×3 IMPLANT
KNIFE CRESCENT 1.75 EDGEAHEAD (BLADE) IMPLANT
KNIFE GRIESHABER SHARP 2.5MM (MISCELLANEOUS) ×9 IMPLANT
MICROPICK 25G (MISCELLANEOUS)
NDL 18GX1X1/2 (RX/OR ONLY) (NEEDLE) ×1 IMPLANT
NDL 25GX 5/8IN NON SAFETY (NEEDLE) IMPLANT
NDL HYPO 30X.5 LL (NEEDLE) IMPLANT
NDL PRECISIONGLIDE 27X1.5 (NEEDLE) IMPLANT
NEEDLE 18GX1X1/2 (RX/OR ONLY) (NEEDLE) ×9 IMPLANT
NEEDLE 25GX 5/8IN NON SAFETY (NEEDLE) IMPLANT
NEEDLE HYPO 30X.5 LL (NEEDLE) ×6 IMPLANT
NEEDLE PRECISIONGLIDE 27X1.5 (NEEDLE) IMPLANT
NS IRRIG 1000ML POUR BTL (IV SOLUTION) ×3 IMPLANT
PACK VITRECTOMY CUSTOM (CUSTOM PROCEDURE TRAY) ×3 IMPLANT
PAD ARMBOARD 7.5X6 YLW CONV (MISCELLANEOUS) ×6 IMPLANT
PAK PIK VITRECTOMY CVS 25GA (OPHTHALMIC) IMPLANT
PIC ILLUMINATED 25G (OPHTHALMIC)
PICK MICROPICK 25G (MISCELLANEOUS) IMPLANT
PIK ILLUMINATED 25G (OPHTHALMIC) IMPLANT
PROBE LASER ILLUM FLEX CVD 25G (OPHTHALMIC) IMPLANT
REPL STRA BRUSH NDL (NEEDLE) IMPLANT
REPL STRA BRUSH NEEDLE (NEEDLE) IMPLANT
RESERVOIR BACK FLUSH (MISCELLANEOUS) IMPLANT
ROLLS DENTAL (MISCELLANEOUS) ×6 IMPLANT
SLEEVE SCLERAL TYPE 270 (Ophthalmic Related) ×2 IMPLANT
SPEAR EYE SURG WECK-CEL (MISCELLANEOUS) ×12 IMPLANT
SPONGE GROOVED SILICONE 4X12X8 (Ophthalmic Related) ×2 IMPLANT
SPONGE SURGIFOAM ABS GEL 12-7 (HEMOSTASIS) ×2 IMPLANT
STOPCOCK 4 WAY LG BORE MALE ST (IV SETS) IMPLANT
SUT CHROMIC 7 0 TG140 8 (SUTURE) ×3 IMPLANT
SUT ETHILON 9 0 TG140 8 (SUTURE) ×2 IMPLANT
SUT MERSILENE 4 0 RV 2 (SUTURE) ×8 IMPLANT
SUT SILK 2 0 (SUTURE) ×3
SUT SILK 2-0 18XBRD TIE 12 (SUTURE) ×2 IMPLANT
SUT SILK 4 0 RB 1 (SUTURE) ×3 IMPLANT
SYR 20CC LL (SYRINGE) IMPLANT
SYR 50ML LL SCALE MARK (SYRINGE) ×2 IMPLANT
SYR 5ML LL (SYRINGE) IMPLANT
SYR BULB 3OZ (MISCELLANEOUS) ×3 IMPLANT
SYR TB 1ML LUER SLIP (SYRINGE) IMPLANT
SYRINGE 10CC LL (SYRINGE) ×4 IMPLANT
TIRE RTNL 2.5XGRV CNCV 9X (Ophthalmic Related) ×1 IMPLANT
TOWEL OR 17X24 6PK STRL BLUE (TOWEL DISPOSABLE) ×3 IMPLANT
TUBING HIGH PRESS EXTEN 6IN (TUBING) IMPLANT
WATER STERILE IRR 1000ML POUR (IV SOLUTION) ×3 IMPLANT
WIPE INSTRUMENT VISIWIPE 73X73 (MISCELLANEOUS) ×3 IMPLANT

## 2016-10-25 NOTE — Anesthesia Postprocedure Evaluation (Signed)
Anesthesia Post Note  Patient: Johnathan [name]  Procedure(s) Performed: Procedure(s) (LRB): SCLERAL BUCKLE WITH CRYO (Left) GAS/FLUID EXCHANGE (Left) PHOTOCOAGULATION WITH LASER (Left)  Patient location during evaluation: PACU Anesthesia Type: General Level of consciousness: awake, oriented, lethargic and patient cooperative Pain management: pain level controlled Vital Signs Assessment: post-procedure vital signs reviewed and stable Respiratory status: spontaneous breathing, nonlabored ventilation, respiratory function stable and patient connected to nasal cannula oxygen Cardiovascular status: blood pressure returned to baseline Anesthetic complications: no    Last Vitals:  Vitals:   10/25/16 1815 10/25/16 1845  BP: 109/67 108/66  Pulse:    Resp: 16   Temp:      Last Pain:  Vitals:   10/25/16 1915  TempSrc:   PainSc: Asleep                 Shavaughn Seidl COKER

## 2016-10-25 NOTE — H&P (Signed)
I examined the patient today and there is no change in the medical status 

## 2016-10-25 NOTE — Anesthesia Preprocedure Evaluation (Signed)

## 2016-10-25 NOTE — Brief Op Note (Signed)
Brief Operative note   Preoperative diagnosis:  retinal detachment left eye Postoperative diagnosis  Rhegmatogenous retinal detachment left eye  Procedures: Scleral buckle, laser, gas injection left eye  Surgeon:  Sherrie GeorgeJohn D Angelica Frandsen, MD...  Assistant:  Rosalie DoctorLisa Johnson SA    Anesthesia: General  Specimen: none  Estimated blood loss:  1cc  Complications: none  Patient sent to PACU in good condition  Composed by Sherrie GeorgeJohn D Jamariah Tony MD  Dictation number: [id number]

## 2016-10-25 NOTE — Anesthesia Procedure Notes (Signed)
Procedure Name: Intubation Date/Time: 10/25/2016 2:00 PM Performed by: Fransisca KaufmannMEYER, Tanessa Tidd E Pre-anesthesia Checklist: Patient identified, Emergency Drugs available, Suction available and Patient being monitored Patient Re-evaluated:Patient Re-evaluated prior to inductionOxygen Delivery Method: Circle System Utilized Preoxygenation: Pre-oxygenation with 100% oxygen Intubation Type: IV induction Ventilation: Mask ventilation without difficulty Laryngoscope Size: Mac and 4 Grade View: Grade I Tube type: Oral Tube size: 7.5 mm Number of attempts: 1 Airway Equipment and Method: Stylet and Oral airway Placement Confirmation: ETT inserted through vocal cords under direct vision,  positive ETCO2 and breath sounds checked- equal and bilateral Secured at: 23 cm Tube secured with: Tape Dental Injury: Teeth and Oropharynx as per pre-operative assessment

## 2016-10-25 NOTE — Transfer of Care (Signed)
Immediate Anesthesia Transfer of Care Note  Patient: Johnathan [name]  Procedure(s) Performed: Procedure(s): SCLERAL BUCKLE WITH CRYO (Left) GAS/FLUID EXCHANGE (Left) PHOTOCOAGULATION WITH LASER (Left)  Patient Location: PACU  Anesthesia Type:General  Level of Consciousness: awake, sedated, lethargic and responds to stimulation  Airway & Oxygen Therapy: Patient Spontanous Breathing and Patient connected to nasal cannula oxygen  Post-op Assessment: Report given to RN, Post -op Vital signs reviewed and stable and Patient moving all extremities X 4  Post vital signs: Reviewed and stable  Last Vitals:  Vitals:   10/25/16 0926 10/25/16 1715  BP: (!) 152/105   Pulse: (!) 55   Resp: 20   Temp: 36.7 C (P) 36.5 C    Last Pain:  Vitals:   10/25/16 0926  TempSrc: Oral      Patients Stated Pain Goal: 3 (10/25/16 1200)  Complications: No apparent anesthesia complications

## 2016-10-26 ENCOUNTER — Encounter (HOSPITAL_COMMUNITY): Payer: Self-pay | Admitting: Ophthalmology

## 2016-10-26 DIAGNOSIS — H33012 Retinal detachment with single break, left eye: Secondary | ICD-10-CM | POA: Diagnosis not present

## 2016-10-26 MED ORDER — HYDROCODONE-ACETAMINOPHEN 5-325 MG PO TABS
1.0000 | ORAL_TABLET | ORAL | 0 refills | Status: DC | PRN
Start: 2016-10-26 — End: 2017-03-06

## 2016-10-26 MED ORDER — DORZOLAMIDE HCL 2 % OP SOLN: 1.0000 [drp] | Freq: Three times a day (TID) | OPHTHALMIC | 12 refills | Status: DC

## 2016-10-26 MED ORDER — BRIMONIDINE TARTRATE 0.2 % OP SOLN: 1.0000 [drp] | Freq: Two times a day (BID) | OPHTHALMIC | 12 refills | Status: DC

## 2016-10-26 MED ORDER — PREDNISOLONE ACETATE 1 % OP SUSP: 1.0000 [drp] | Freq: Four times a day (QID) | OPHTHALMIC | 0 refills | Status: DC

## 2016-10-26 MED ORDER — BACITRACIN-POLYMYXIN B 500-10000 UNIT/GM OP OINT: 1.0000 "application " | TOPICAL_OINTMENT | Freq: Three times a day (TID) | OPHTHALMIC | 0 refills | Status: DC

## 2016-10-26 NOTE — Discharge Summary (Signed)
Discharge summary not needed on [facility] patients per medical records. 

## 2016-10-26 NOTE — Op Note (Signed)
[name], Johnathan [name]                ACCOUNT NO.:  [id number]  MEDICAL RECORD NO.:  [id number]  LOCATION:  6N19C                        FACILITY:  [facility]  PHYSICIAN:  Beulah GandyJohn D. Ashley RoyaltyMatthews, M.D. DATE OF BIRTH:  [birth year]  DATE OF PROCEDURE:  10/25/2016 DATE OF DISCHARGE:                              OPERATIVE REPORT   ADMISSION DIAGNOSIS:  Rhegmatogenous retinal detachment, left eye.  PROCEDURE:  Scleral buckle, left eye with gas injection and laser and cryopexy.  SURGEON:  Beulah GandyJohn D. Ashley RoyaltyMatthews, M.D.  ASSISTANT:  Rosalie DoctorLisa Johnson, SA.  ANESTHESIA:  General.  DETAILS:  Usual prep and drape, 360-degree limbal peritomy, isolation of 4 rectus muscles on 2-0 silk.  Localization of the break at 11 o'clock with cryopexy around the break.  Scleral dissection for 360 degrees to admit a #279 intrascleral implant with 3 mm trim from the posterior edge, except in the 11 o'clock position.  Diathermy was placed in the bed, 2 sutures per quadrant for a total of 8 scleral sutures were placed in the scleral flaps.  The 279 implant was placed around the globe with the joint at 8 o'clock.  240 band was placed around the globe with a 270 sleeve at 8 o'clock.  Perforation site chosen at 10 o'clock and 10:30 o'clock.  A large amount of clear colorless subretinal fluid came forth as well as intravitreal gas.  C3F8 5% was injected into the vitreous cavity to reinflate the globe.  The buckle was placed, scleral flaps were closed, sutures were drawn securely, and the free ends removed. Indirect ophthalmoscopy showed the retina to be lying nicely in place on the scleral buckle.  The indirect ophthalmoscope laser was moved into place.  465 burns were placed around the retinal periphery.  The power between 900 and 1000 mW, 1000 microns each, and duration between 0.1 and 0.2 seconds each.  The buckle was adjusted and trimmed.  The band was adjusted and trimmed.  The sutures were trimmed.  The conjunctiva was reposited  with 7-0 chromic suture.  Polymyxin and gentamicin were irrigated into tenon space.  Atropine solution was applied.  Marcaine was injected around the globe for postop pain.  Decadron 10 mg was injected into the lower subconjunctival space.  Polysporin ophthalmic ointment and a patch and shield were placed.  Closing pressure was 10 with a Barraquer tonometer.  COMPLICATIONS:  None.  DURATION:  2 hours 45 minutes.     Beulah GandyJohn D. Ashley RoyaltyMatthews, M.D.     JDM/MEDQ  D:  10/25/2016  T:  10/26/2016  Job:  [id number]

## 2016-10-26 NOTE — Progress Notes (Signed)
10/26/2016, 6:32 AM  Mental Status:  Awake, Alert, Oriented.  Given two vicodin 30 minutes ago by nurse.  Woke patient up to give him pain medication!!  Anterior segment: Cornea  Clear    Anterior Chamber Clear    Lens:   Cataract  Intra Ocular Pressure 21 mmHg with Tonopen  Vitreous: Clear 100%gas bubble   Retina:  Attached   Impression: Excellent result Retina attached   Final Diagnosis: Active Problems:   Rhegmatogenous retinal detachment of left eye   Plan: start post operative eye drops.  Discharge to home.  Give post operative instructions  [name], JOHN D 10/26/2016, 6:32 AM

## 2016-10-26 NOTE — Op Note (Signed)
NAMMarene [name]:  [place], [place]                ACCOUNT NO.:  [id number]  MEDICAL RECORD NO.:  [id number]  LOCATION:  6N19C                        FACILITY:  [facility]  PHYSICIAN:  Beulah GandyJohn D. Ashley RoyaltyMatthews, M.D. 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  PROCEDURE:  Scleral buckle, left eye.  Gas injection, left eye. Cryopexy, left eye.  Laser in the left eye.  SURGEON:  Beulah GandyJohn D. Ashley RoyaltyMatthews, M.D.  ASSISTANT:  Rosalie DoctorLisa Johnson, SA.  ANESTHESIA:  General.  DETAILS:  Usual prep and drape.  A 360-degree conjunctival peritomy, isolation of 4 rectus muscles on 2-0 silk.  Scleral dissection for 360 degrees to admit a #279 scleral implant.  The implant had 3 mm trim from the posterior edge.  508G radial segment was fashioned beneath the break at 11 o'clock.  Diathermy was placed in the bed.  The scleral buckle was placed with a joint at 8 o'clock.  240 band was placed around the globe with a 270 sleeve at 8 o'clock.  Perforation site was chosen at 10 o'clock and 10:30 o'clock.  A large amount of clear colorless subretinal fluid came forth and intravitreal gas came forth through the sclerotomy points.  The radial 508G segment was placed over the break.  The sutures were drawn securely.  C3F8 in a 5% concentration was injected to reinflate the globe.  Cryopexy marks were placed on the retinal break. The indirect ophthalmoscope laser was moved into place.  465 burns were placed around the retinal periphery with the power between 900 and 1000 mW, 1000 microns each, and 0.1-0.2 seconds each.  The buckle was adjusted and trimmed.  The band was adjusted and trimmed.  Indirect ophthalmoscopy showed the retina to be lying nicely on the scleral buckle with the retinal break well supported by the radial element.  The conjunctiva was reposited with 7-0 chromic suture.   Paracentesis x1 obtained, closing pressure of 10 with Barraquer tonometer.  Polymyxin and gentamicin were irrigated into tenon space.  Atropine solution was applied.  Marcaine was injected around the globe for postop pain. Decadron 10 mg was injected into the lower subconjunctival space. Polysporin ophthalmic ointment and a patch and shield were placed.  The patient was awakened and taken to recovery in satisfactory condition.  COMPLICATIONS:  None.  DURATION:  2 hours 45 minutes.     Beulah GandyJohn D. Ashley RoyaltyMatthews, M.D.     JDM/MEDQ  D:  10/25/2016  T:  10/26/2016  Job:  [id number]

## 2016-10-31 ENCOUNTER — Inpatient Hospital Stay (INDEPENDENT_AMBULATORY_CARE_PROVIDER_SITE_OTHER): Payer: BLUE CROSS/BLUE SHIELD | Admitting: Ophthalmology

## 2016-10-31 DIAGNOSIS — H338 Other retinal detachments: Secondary | ICD-10-CM

## 2016-11-04 ENCOUNTER — Encounter (HOSPITAL_COMMUNITY): Payer: Self-pay | Admitting: Ophthalmology

## 2016-11-21 ENCOUNTER — Encounter (INDEPENDENT_AMBULATORY_CARE_PROVIDER_SITE_OTHER): Payer: BLUE CROSS/BLUE SHIELD | Admitting: Ophthalmology

## 2016-11-21 DIAGNOSIS — H338 Other retinal detachments: Secondary | ICD-10-CM

## 2016-12-06 ENCOUNTER — Ambulatory Visit: Payer: BLUE CROSS/BLUE SHIELD | Admitting: Family Medicine

## 2016-12-23 ENCOUNTER — Encounter (HOSPITAL_COMMUNITY): Payer: Self-pay | Admitting: Ophthalmology

## 2016-12-23 NOTE — OR Nursing (Signed)
Late entry to correct MD departure time. 

## 2017-01-30 ENCOUNTER — Encounter (INDEPENDENT_AMBULATORY_CARE_PROVIDER_SITE_OTHER): Payer: BLUE CROSS/BLUE SHIELD | Admitting: Ophthalmology

## 2017-01-30 DIAGNOSIS — H2512 Age-related nuclear cataract, left eye: Secondary | ICD-10-CM

## 2017-01-30 DIAGNOSIS — H338 Other retinal detachments: Secondary | ICD-10-CM

## 2017-01-30 DIAGNOSIS — H43811 Vitreous degeneration, right eye: Secondary | ICD-10-CM

## 2017-03-06 ENCOUNTER — Ambulatory Visit (INDEPENDENT_AMBULATORY_CARE_PROVIDER_SITE_OTHER): Payer: BLUE CROSS/BLUE SHIELD | Admitting: Family Medicine

## 2017-03-06 ENCOUNTER — Encounter: Payer: Self-pay | Admitting: Family Medicine

## 2017-03-06 VITALS — BP 140/94 | HR 94 | Temp 100.4°F | Wt 206.4 lb

## 2017-03-06 DIAGNOSIS — R05 Cough: Secondary | ICD-10-CM | POA: Diagnosis not present

## 2017-03-06 DIAGNOSIS — H5702 Anisocoria: Secondary | ICD-10-CM | POA: Insufficient documentation

## 2017-03-06 DIAGNOSIS — J101 Influenza due to other identified influenza virus with other respiratory manifestations: Secondary | ICD-10-CM | POA: Insufficient documentation

## 2017-03-06 DIAGNOSIS — R059 Cough, unspecified: Secondary | ICD-10-CM

## 2017-03-06 DIAGNOSIS — J029 Acute pharyngitis, unspecified: Secondary | ICD-10-CM

## 2017-03-06 LAB — POCT INFLUENZA A/B
INFLUENZA A, POC: NEGATIVE
INFLUENZA B, POC: POSITIVE — AB

## 2017-03-06 LAB — POCT RAPID STREP A (OFFICE): RAPID STREP A SCREEN: NEGATIVE

## 2017-03-06 MED ORDER — OSELTAMIVIR PHOSPHATE 75 MG PO CAPS: 75.0000 mg | ORAL_CAPSULE | Freq: Two times a day (BID) | ORAL | 0 refills | Status: DC

## 2017-03-06 NOTE — Assessment & Plan Note (Addendum)
Patient with asymmetric enlargement of his left pupil compared to the right. He has had multiple surgeries on the left eye and has evidence of a cataract on the left side and I suspect that is the cause of the enlargement though we do not have any prior documentation to reflect this. He is neurologically intact with the exception of his left pupil. Vision was checked today and he is unable to appreciate the letters on the vision chart though he does report he has vision in the left eye. He reports a vague sensation in his eye that is not pain. Bilateral eyes with mild conjunctival erythema. I attempted to contact Dr[facility] at [facility] though they're closed this week. I spoke with the on-call ophthalmologist for [facility], Dr Cathey Endow, and advised him of the patient's prior surgical history and the enlargement of his pupil and his inability to visualize the letter's on the eye chart. Also advised that it appeared that he had a cataract and of the irregularity of the left pupil. Dr. Cathey Endow felt it was probable that these changes were related to his prior surgeries though did advise the patient be evaluated at their office today. He would then determine if the patient needed any additional workup or if this was likely related to his prior surgeries. I provided the patient with the address of Dr. Ovidio Kin office and advised him to go there immediately for evaluation. Given that the patient does have the flu I did advise that he wear his mask when he gets to Dr. Ovidio Kin office.

## 2017-03-06 NOTE — Assessment & Plan Note (Signed)
Patient tested positive for influenza B today. This would explain his sore throat and body aches and mild cough. Also could explain the mild conjunctival erythema. We will treat with Tamiflu. He will stay well hydrated. He will monitor his symptoms.

## 2017-03-06 NOTE — Progress Notes (Signed)
Pre visit review using our clinic review tool, if applicable. No additional management support is needed unless otherwise documented below in the visit note. 

## 2017-03-06 NOTE — Patient Instructions (Addendum)
Nice to see you. You have influenza. We will treat you with Tamiflu. You need to stay well hydrated. If you develop any new symptoms or cough productive of blood or shortness of breath please seek medical attention immediately.  Please go to ophthalmologists office at [address] [place], [place] [address]. I spoke with Dr Cathey Endow who advised evaluation in his office.

## 2017-03-06 NOTE — Progress Notes (Signed)
Marikay Alar, MD Phone: [phone]  Johnathan [name] is a 51 y.o. male who presents today for same-day visit.  Patient seen in the office for 2 days of body aches, sore throat, minimal cough productive of mild phlegm, and feeling feverish. No congestion in his upper respiratory area. No postnasal drip. Notes he has slowly been getting worse. No shortness of breath. Mild headache if he coughs. Does note sick contacts with influenza. No numbness or weakness. No noted vision changes. On exam patient was noted to have a left pupil that was larger than the right pupil. It appears that he had a cataract as well. He notes he's had 2 prior surgeries on his left eye in the last year. He notes he's never been advised that his left pupil was larger than the right. 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 He notes the left eye does have a somewhat odd sensation to it at times though he does not describe this as pain. He does not have this sensation at this time. It is not burning or itching. His last surgery was in November of last year. He is followed by Dr[facility].  PMH: nonsmoker.   ROS see history of present illness  Objective  Physical Exam Vitals:   03/06/17 1319  BP: (!) 140/94  Pulse: 94  Temp: (!) 100.4 F (38 C)    BP Readings from Last 3 Encounters:  03/06/17 (!) 140/94  10/26/16 107/63  07/14/16 110/80   Wt Readings from Last 3 Encounters:  03/06/17 206 lb 6.4 oz (93.6 kg)  10/25/16 205 lb (93 kg)  07/14/16 206 lb 12.8 oz (93.8 kg)    Physical Exam  Constitutional: No distress.  HENT:  Head: Normocephalic and atraumatic.  Mouth/Throat: Oropharynx is clear and moist. No oropharyngeal  exudate.  Normal TMs bilaterally  Eyes:  Left pupil is enlarged compared to right pupil, left pupil with slight irregularity to the borders, apparent cataract in the left eye, right pupil is reactive to light, left pupil appears minimally reactive, mild conjunctival erythema bilaterally  Neck: Neck supple.  Cardiovascular: Normal rate, regular rhythm and normal heart sounds.   Pulmonary/Chest: Effort normal and breath sounds normal.  Lymphadenopathy:    He has no cervical adenopathy.  Neurological: He is alert.  Vision is 20/20 right eye and bilaterally, unable to appreciate the latter is on the vision chart with the left eye, pupillary defect previously described otherwise CN 3-12 intact, 5/5 strength in bilateral biceps, triceps, grip, quads, hamstrings, plantar and dorsiflexion, sensation to light touch intact in bilateral UE and LE, normal gait  Skin: He is not diaphoretic.     Assessment/Plan: Please see individual problem list.  Influenza B Patient tested positive for influenza B today. This would explain his sore throat and body aches and mild cough. Also could explain the mild conjunctival erythema. We will treat with Tamiflu. He will stay well hydrated. He will monitor his symptoms.  Pupil asymmetry Patient with asymmetric enlargement of his left pupil compared to the right. 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 He is neurologically intact with the exception of his left pupil. Vision was checked today and he is unable to appreciate the letters on the vision chart though he does report he has vision in the left eye. He reports a vague sensation in his eye that is not pain. Bilateral eyes with mild conjunctival erythema. I attempted to contact Dr. [facility] at [facility] though they're closed this week. I  spoke with the on-call ophthalmologist for [facility], Dr Cathey Endow, and advised him of the patient's prior surgical history and the enlargement of his pupil and his inability to visualize the letter's on the eye chart. Also advised that it appeared that he had a cataract and of the irregularity of the left pupil. Dr. Cathey Endow felt it was probable that these changes were related to his prior surgeries though did advise the patient be evaluated at their office today. He would then determine if the patient needed any additional workup or if this was likely related to his prior surgeries. I provided the patient with the address of Dr. Ovidio Kin office and advised him to go there immediately for evaluation. Given that the patient does have the flu I did advise that he wear his mask when he gets to Dr. Ovidio Kin office.   Orders Placed This Encounter  Procedures  . POCT Influenza A/B  . POCT rapid strep A    Meds ordered this encounter  Medications  . oseltamivir (TAMIFLU) 75 MG capsule    Sig: Take 1 capsule (75 mg total) by mouth 2 (two) times daily.    Dispense:  10 capsule    Refill:  0   Marikay Alar, MD [facility]

## 2017-03-07 ENCOUNTER — Telehealth: Payer: Self-pay | Admitting: Family Medicine

## 2017-03-07 NOTE — Telephone Encounter (Signed)
fyi

## 2017-03-07 NOTE — Telephone Encounter (Signed)
Dr. Lajuana Carry called from Johnathan [name] called in regards to thi spt. He just wanted to give Dr. Birdie Sons an update, states that pt is doing just fine and has him seeing his retina doctor soon.

## 2017-03-07 NOTE — Telephone Encounter (Signed)
Noted. Thanks.

## 2017-04-12 ENCOUNTER — Encounter (INDEPENDENT_AMBULATORY_CARE_PROVIDER_SITE_OTHER): Payer: BLUE CROSS/BLUE SHIELD | Admitting: Ophthalmology

## 2017-07-31 ENCOUNTER — Ambulatory Visit (INDEPENDENT_AMBULATORY_CARE_PROVIDER_SITE_OTHER): Payer: BLUE CROSS/BLUE SHIELD | Admitting: Ophthalmology

## 2017-07-31 DIAGNOSIS — H338 Other retinal detachments: Secondary | ICD-10-CM | POA: Diagnosis not present

## 2017-07-31 DIAGNOSIS — H43813 Vitreous degeneration, bilateral: Secondary | ICD-10-CM | POA: Diagnosis not present

## 2017-07-31 DIAGNOSIS — H2511 Age-related nuclear cataract, right eye: Secondary | ICD-10-CM | POA: Diagnosis not present

## 2017-07-31 DIAGNOSIS — H26492 Other secondary cataract, left eye: Secondary | ICD-10-CM

## 2017-09-07 ENCOUNTER — Encounter (INDEPENDENT_AMBULATORY_CARE_PROVIDER_SITE_OTHER): Payer: BLUE CROSS/BLUE SHIELD | Admitting: Ophthalmology

## 2017-09-07 DIAGNOSIS — H26492 Other secondary cataract, left eye: Secondary | ICD-10-CM | POA: Diagnosis not present

## 2017-09-21 ENCOUNTER — Encounter (INDEPENDENT_AMBULATORY_CARE_PROVIDER_SITE_OTHER): Payer: BLUE CROSS/BLUE SHIELD | Admitting: Ophthalmology

## 2017-09-21 DIAGNOSIS — H2702 Aphakia, left eye: Secondary | ICD-10-CM

## 2022-04-11 ENCOUNTER — Encounter: Payer: BLUE CROSS/BLUE SHIELD | Admitting: Family Medicine

## 2022-05-03 ENCOUNTER — Encounter: Payer: BLUE CROSS/BLUE SHIELD | Admitting: Family Medicine

## 2022-05-03 ENCOUNTER — Telehealth: Payer: Self-pay

## 2022-05-03 NOTE — Telephone Encounter (Signed)
Unable to reach pt or his wife and left v/m requesting pt to cb to [facility] to verify wants to cancel CPX appt or if has S/T does he want to be seen for that. Sending not to Dr Para March and Shanda Bumps CMA. Pt last seen by Dr Para March 07/14/2016.

## 2022-05-03 NOTE — Telephone Encounter (Signed)
Noted. Thanks.

## 2022-05-03 NOTE — Telephone Encounter (Signed)
[facility] Night - Client Nonclinical Telephone Record  [facility] Client [facility] Night - Client Client Site [facility] [place] - Night Provider Raechel Ache - MD Contact Type Call Who Is Calling Patient / Member / Family / Caregiver Caller Name Aubery Douthat Caller Phone Number [phone] Patient Name Johnathan [name] Patient DOB 12/22/1965 Call Type Message Only Information Provided Reason for Call Request to [facility] Appointment Initial Comment Caller states they have an appt tomorrow at noon. They have been on vacation and the family has been coming down with strep. He is supposed to be seen for a physical but would like to cancel the appt. Patient request to speak to RN No Additional Comment Denied triage. Will call back to reschedule, provided office hours. Disp. Time Disposition Final User 05/02/2022 3:23:34 PM General Information Provided Yes Socrates, Shanda Bumps Call Closed By: Devona Konig Transaction Date/Time: 05/02/2022 3:20:00 PM (ET

## 2023-02-09 ENCOUNTER — Telehealth: Payer: Self-pay | Admitting: Family Medicine

## 2023-02-09 NOTE — Telephone Encounter (Signed)
Patient wife called in and wanted to schedule Johnathan [name] for his annual physical. Patient hasn't been seen since 2017. She wanted to know if he could re-establish care with Dr. Damita Dunnings. Thank you!

## 2023-02-10 NOTE — Telephone Encounter (Signed)
LVM for patient wife to call back and schedule.

## 2023-02-10 NOTE — Telephone Encounter (Signed)
Please schedule when possible.  Thanks.

## 2023-02-10 NOTE — Telephone Encounter (Signed)
Lvmtcb

## 2023-04-20 ENCOUNTER — Ambulatory Visit
Admission: RE | Admit: 2023-04-20 | Discharge: 2023-04-20 | Disposition: A | Discharge status: Home / Self Care | Payer: BC Managed Care – PPO | Source: Ambulatory Visit | Attending: Urgent Care | Admitting: Urgent Care

## 2023-04-20 VITALS — BP 132/71 | HR 62 | Temp 98.1°F | Resp 16

## 2023-04-20 DIAGNOSIS — J019 Acute sinusitis, unspecified: Secondary | ICD-10-CM | POA: Diagnosis not present

## 2023-04-20 DIAGNOSIS — J209 Acute bronchitis, unspecified: Secondary | ICD-10-CM

## 2023-04-20 DIAGNOSIS — B9689 Other specified bacterial agents as the cause of diseases classified elsewhere: Secondary | ICD-10-CM

## 2023-04-20 MED ORDER — DOXYCYCLINE HYCLATE 100 MG PO CAPS
100.0000 mg | ORAL_CAPSULE | Freq: Two times a day (BID) | ORAL | 0 refills | Status: AC
Start: 2023-04-20 — End: 2023-04-27

## 2023-04-20 MED ORDER — PREDNISONE 50 MG PO TABS
50.0000 mg | ORAL_TABLET | Freq: Every day | ORAL | 0 refills | Status: AC
Start: 2023-04-20 — End: 2023-04-25

## 2023-04-20 NOTE — Discharge Instructions (Signed)
I have prescribed both an antibiotic and a corticosteroid to help with your symptoms.  The antibiotic will treat any bacterial infection causing your sinus inflammation and cough.  The steroid (prednisone) will help reduce inflammation and allow your sinuses to drain and resolution of your cough.  I also recommend that you continue to use guaifenesin (Mucinex) to thin your mucus secretions and make your cough more effective.  You may continue to use ibuprofen for your headache and Sudafed sinus.  Follow up here or with your primary care provider if your symptoms are worsening or not improving with treatment.

## 2023-04-20 NOTE — ED Notes (Signed)
Patient presents to UC for cough, nasal congestion, and HA x 1 week.

## 2023-04-20 NOTE — ED Triage Notes (Signed)
Patient presents to UC for cough, facial pressure, HA, and nasal congestion x 1 week. Taking advil sinus.

## 2023-04-20 NOTE — ED Provider Notes (Signed)
Johnathan [name]    CSN: [id number] Arrival date & time: 04/20/23  1847      History   Chief Complaint Chief Complaint  Patient presents with   Cough    Can't get rid of cough from sinus. Head is hurting and have drainage for about two weeks. Can't sleep because drainage and coughing. - Entered by patient    HPI Johnathan [name] is a 57 y.o. male.    Cough   Patient presents to urgent care with cough, chest congestion, sinus pain and pressure x 1 week.  He endorses headache and nasal congestion with drainage for about 2 weeks.  He states drainage and coughing is preventing him from sleep.  Past Medical History:  Diagnosis Date   Family history of adverse reaction to anesthesia    daughter had PONV   History of gout    "feet"   History of hiatal hernia    Kidney stones    Laceration 1985   left forearm arterial laceration   Pneumonia 2017   PONV (postoperative nausea and vomiting)     Patient Active Problem List   Diagnosis Date Noted   Influenza B 03/06/2017   Pupil asymmetry 03/06/2017   Routine general medical examination at a health care facility 07/14/2016   Advance care planning 07/14/2016   Rhegmatogenous retinal detachment of left eye 06/14/2016   HYPERCHOLESTEROLEMIA, MIXED 05/08/2009    Past Surgical History:  Procedure Laterality Date   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;  Dr.Carter   EYE SURGERY     GAS/FLUID EXCHANGE Left 10/25/2016   Procedure: GAS/FLUID EXCHANGE;  Surgeon: Sherrie George, MD;  Location: [facility] OR;  Service: Ophthalmology;  Laterality: Left;   KIDNEY STONE SURGERY     LITHOTRIPSY     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   with gas, cryo and bubble    PARS PLANA VITRECTOMY Left 06/14/2016   Procedure: PARS PLANA VITRECTOMY WITH 25 GAUGE with Laser and Gas injection.;  Surgeon: Sherrie George, MD;  Location: [facility] OR;  Service: Ophthalmology;  Laterality: Left;   PHOTOCOAGULATION  WITH LASER Left 10/25/2016   Procedure: PHOTOCOAGULATION WITH LASER;  Surgeon: Sherrie George, MD;  Location[facility] OR;  Service: Ophthalmology;  Laterality: Left;   SCLERAL BUCKLE WITH CRYO Left 10/25/2016   Procedure: SCLERAL BUCKLE WITH CRYO;  Surgeon: Sherrie George, MD;  Location: [facility] OR;  Service: Ophthalmology;  Laterality: Left;   VARICOSE VEIN SURGERY Left 1990's   thigh, Dr.Humphreys       Home Medications    Prior to Admission medications   Medication Sig Start Date End Date Taking? Authorizing Provider  ibuprofen (ADVIL,MOTRIN) 200 MG tablet Take 600 mg by mouth every 6 (six) hours as needed for mild pain or moderate pain. Headache or foot pain    [provider]  Omega 3-6-9 Fatty Acids (OMEGA-3-6-9 PO) Take 1 tablet by mouth 2 (two) times daily.     [provider]  oseltamivir (TAMIFLU) 75 MG capsule Take 1 capsule (75 mg total) by mouth 2 (two) times daily. 03/06/17   Glori Luis, MD    Family History Family History  Problem Relation Age of Onset   Dementia Mother    Diabetes Mother    Heart disease Father    Depression Father        manic   Hypertension Father    Alzheimer's disease Father    Colon cancer  Neg Hx    Prostate cancer Neg Hx     Social History Social History   Tobacco Use   Smoking status: Never   Smokeless tobacco: Never  Substance Use Topics   Alcohol use: Yes    Comment: rare   Drug use: No     Allergies   Penicillins and Zofran [ondansetron hcl]   Review of Systems Review of Systems  Respiratory:  Positive for cough.      Physical Exam Triage Vital Signs ED Triage Vitals  Enc Vitals Group     BP      Pulse      Resp      Temp      Temp src      SpO2      Weight      Height      Head Circumference      Peak Flow      Pain Score      Pain Loc      Pain Edu?      Excl. in GC?    No data found.  Updated Vital Signs There were no vitals taken for this visit.  Visual Acuity Right Eye  Distance:   Left Eye Distance:   Bilateral Distance:    Right Eye Near:   Left Eye Near:    Bilateral Near:     Physical Exam Vitals reviewed.  Constitutional:      Appearance: Normal appearance. He is ill-appearing.  HENT:     Nose:     Right Sinus: Maxillary sinus tenderness present.     Left Sinus: Maxillary sinus tenderness present.  Cardiovascular:     Rate and Rhythm: Normal rate and regular rhythm.     Pulses: Normal pulses.  Pulmonary:     Effort: Pulmonary effort is normal.     Breath sounds: Normal breath sounds.  Skin:    General: Skin is warm and dry.  Neurological:     General: No focal deficit present.     Mental Status: He is alert and oriented to person, place, and time.  Psychiatric:        Mood and Affect: Mood normal.        Behavior: Behavior normal.      UC Treatments / Results  Labs (all labs ordered are listed, but only abnormal results are displayed) Labs Reviewed - No data to display  EKG   Radiology No results found.  Procedures Procedures (including critical care time)  Medications Ordered in UC Medications - No data to display  Initial Impression / Assessment and Plan / UC Course  I have reviewed the triage vital signs and the nursing notes.  Pertinent labs & imaging results that were available during my care of the patient were reviewed by me and considered in my medical decision making (see chart for details).   Johnathan [name] is a 57 y.o. male presenting with sinusitis and cough. Patient is afebrile without recent antipyretics, satting well on room air. Overall is ill appearing though non-toxic, well hydrated, without respiratory distress. Pulmonary exam is unremarkable.  Lungs CTAB without wheezing, rhonchi, rales. RRR.  Maxillary sinus tenderness is present bilaterally.  Given duration of symptoms, concern for acute bacterial sinusitis.  Recommended treatment with antibacterial therapy.  Also suggesting corticosteroid to speed  resolution of the sinus inflammation and resolution of cough.  Patient is intolerant of penicillins.  Will prescribe doxycycline as well as a course of prednisone.  Reviewed chart  history.   Counseled patient on potential for adverse effects with medications prescribed/recommended today, ER and return-to-clinic precautions discussed, patient verbalized understanding and agreement with care plan.   Final Clinical Impressions(s) / UC Diagnoses   Final diagnoses:  None   Discharge Instructions   None    ED Prescriptions   None    PDMP not reviewed this encounter.   Charma Igo, [place] 04/20/23 1907

## 2023-07-01 ENCOUNTER — Encounter (HOSPITAL_COMMUNITY): Payer: Self-pay

## 2023-07-01 ENCOUNTER — Emergency Department (HOSPITAL_COMMUNITY): Payer: BC Managed Care – PPO

## 2023-07-01 ENCOUNTER — Other Ambulatory Visit: Payer: Self-pay

## 2023-07-01 ENCOUNTER — Emergency Department (HOSPITAL_COMMUNITY)
Admission: EM | Admit: 2023-07-01 | Discharge: 2023-07-01 | Disposition: A | Discharge status: Home / Self Care | Payer: BC Managed Care – PPO | Attending: Emergency Medicine | Admitting: Emergency Medicine

## 2023-07-01 DIAGNOSIS — S63287A Dislocation of proximal interphalangeal joint of left little finger, initial encounter: Secondary | ICD-10-CM | POA: Insufficient documentation

## 2023-07-01 DIAGNOSIS — S63259A Unspecified dislocation of unspecified finger, initial encounter: Secondary | ICD-10-CM

## 2023-07-01 DIAGNOSIS — W230XXA Caught, crushed, jammed, or pinched between moving objects, initial encounter: Secondary | ICD-10-CM | POA: Insufficient documentation

## 2023-07-01 DIAGNOSIS — S6992XA Unspecified injury of left wrist, hand and finger(s), initial encounter: Secondary | ICD-10-CM | POA: Diagnosis present

## 2023-07-01 MED ORDER — HYDROCODONE-ACETAMINOPHEN 5-325 MG PO TABS
1.0000 | ORAL_TABLET | Freq: Once | ORAL | Status: AC
  Administered 2023-07-01: 1 via ORAL

## 2023-07-01 MED ORDER — HYDROCODONE-ACETAMINOPHEN 5-325 MG PO TABS
2.0000 | ORAL_TABLET | Freq: Once | ORAL | Status: DC
  Filled 2023-07-01: qty 2

## 2023-07-01 MED ORDER — LIDOCAINE HCL (PF) 1 % IJ SOLN
20.0000 mL | Freq: Once | INTRAMUSCULAR | Status: DC
  Filled 2023-07-01: qty 30

## 2023-07-01 MED ORDER — IBUPROFEN 800 MG PO TABS
800.0000 mg | ORAL_TABLET | Freq: Once | ORAL | Status: AC
  Administered 2023-07-01: 800 mg via ORAL
  Filled 2023-07-01: qty 1

## 2023-07-01 NOTE — ED Triage Notes (Signed)
Patient opened up a car door and got his left pinky finger caught in the door.

## 2023-07-01 NOTE — ED Provider Notes (Signed)
EMERGENCY DEPARTMENT AT [facility] Provider Note   CSN: [id number] Arrival date & time: 07/01/23  1043     History  Chief Complaint  Patient presents with   Finger Injury   HPI Johnathan [name] is a 57 y.o. male for finger injury.  Occurred around noon today.  Patient caught his finger in the door.  It is the left pinky finger.  Endorses some tenderness.  Reports obvious deformity of the finger.  HPI     Home Medications Prior to Admission medications   Medication Sig Start Date End Date Taking? Authorizing Provider  ibuprofen (ADVIL,MOTRIN) 200 MG tablet Take 600 mg by mouth every 6 (six) hours as needed for mild pain or moderate pain. Headache or foot pain    [provider]  Omega 3-6-9 Fatty Acids (OMEGA-3-6-9 PO) Take 1 tablet by mouth 2 (two) times daily.     [provider]  oseltamivir (TAMIFLU) 75 MG capsule Take 1 capsule (75 mg total) by mouth 2 (two) times daily. 03/06/17   Glori Luis, MD      Allergies    Penicillins and Zofran Frazier Richards hcl]    Review of Systems   See HPI for pertinent positives  Physical Exam Updated Vital Signs BP 116/77 (BP Location: Right Arm)   Pulse (!) 56   Temp 98 F (36.7 C) (Oral)   Resp 16   Ht 5\' 5"  (1.651 m)   Wt 81.6 kg   SpO2 99%   BMI 29.95 kg/m  Physical Exam Musculoskeletal:     Right hand: Normal. Normal capillary refill. Normal pulse.     Left hand: Deformity present. Normal capillary refill. Normal pulse.     Comments: Obvious deformity of the left fifth digit about the PIP. Sensation intact.     ED Results / Procedures / Treatments   Labs (all labs ordered are listed, but only abnormal results are displayed) Labs Reviewed - No data to display  EKG None  Radiology DG Hand Complete Left  Result Date: 07/01/2023 CLINICAL DATA:  Status post reduction. EXAM: LEFT HAND - COMPLETE 3+ VIEW COMPARISON:  Same day. FINDINGS: There is been successful reduction of  fifth proximal interphalangeal joint. No definite fracture is noted. IMPRESSION: Successful reduction of dislocation of fifth proximal interphalangeal joint. Electronically Signed   By: Lupita Raider M.D.   On: 07/01/2023 14:26   DG Hand Complete Left  Result Date: 07/01/2023 CLINICAL DATA:  Pain after trauma EXAM: LEFT HAND - COMPLETE 3 VIEW COMPARISON:  None Available. FINDINGS: There is a dislocation of the proximal interphalangeal joint of fifth digit. No obvious associated fracture but recommend follow up imaging after relocation to assess for subtle injury. Lateral view is limited as the fingers are overlapping. Ring artifact obscures the proximal phalanx of fourth digit. Slight degenerative changes of the distal interphalangeal joints. IMPRESSION: Dislocation of the proximal interphalangeal joint of fifth digit. Electronically Signed   By: Karen Kays M.D.   On: 07/01/2023 12:53    Procedures Reduction of dislocation  Date/Time: 07/01/2023 2:33 PM  Performed by: Gareth Eagle, PA-C Authorized by: Gareth Eagle, PA-C  Consent: Verbal consent obtained. Risks and benefits: risks, benefits and alternatives were discussed Consent given by: patient Patient understanding: patient states understanding of the procedure being performed Patient consent: the patient's understanding of the procedure matches consent given Procedure consent: procedure consent matches procedure scheduled Patient identity confirmed: verbally with patient Time out: Immediately prior to  procedure a "time out" was called to verify the correct patient, procedure, equipment, support staff and site/side marked as required. Local anesthesia used: no  Anesthesia: Local anesthesia used: no  Sedation: Patient sedated: no  Patient tolerance: patient tolerated the procedure well with no immediate complications Comments: Reduction of fifth digit of left hand about PIP       Medications Ordered in ED Medications   ibuprofen (ADVIL) tablet 800 mg (800 mg Oral Given 07/01/23 1223)  HYDROcodone-acetaminophen (NORCO/VICODIN) 5-325 MG per tablet 1 tablet (1 tablet Oral Given 07/01/23 1301)    ED Course/ Medical Decision Making/ A&P                             Medical Decision Making Amount and/or Complexity of Data Reviewed Radiology: ordered.  Risk Prescription drug management.   57 year old well-appearing male presenting for finger injury.  Exam notable for obvious deformity of the left little finger.  X-rays revealed dislocation but no fracture.  Reduction was successful.  Postreduction x-ray.  Vital stable.  Advised Ortho follow-up.  Discharged home in good condition.        Final Clinical Impression(s) / ED Diagnoses Final diagnoses:  Dislocation of finger, initial encounter    Rx / DC Orders ED Discharge Orders     None         Gareth Eagle, PA-C 07/01/23 1436    Bethann Berkshire, MD 07/03/23 1152

## 2023-07-01 NOTE — Discharge Instructions (Signed)
Evaluation today revealed that you had a finger dislocation of your left pinky finger.  Reduction procedure went well.  Advise you follow-up with Ortho.  You can use ice to help with swelling.  You can also take Tylenol and ibuprofen for symptomatic pain relief.

## 2023-08-28 ENCOUNTER — Ambulatory Visit (INDEPENDENT_AMBULATORY_CARE_PROVIDER_SITE_OTHER): Payer: BC Managed Care – PPO | Admitting: Family Medicine

## 2023-08-28 ENCOUNTER — Encounter: Payer: Self-pay | Admitting: Family Medicine

## 2023-08-28 VITALS — BP 110/72 | HR 70 | Temp 97.2°F | Ht 65.0 in | Wt 177.0 lb

## 2023-08-28 DIAGNOSIS — E7800 Pure hypercholesterolemia, unspecified: Secondary | ICD-10-CM

## 2023-08-28 DIAGNOSIS — E785 Hyperlipidemia, unspecified: Secondary | ICD-10-CM | POA: Diagnosis not present

## 2023-08-28 DIAGNOSIS — Z0000 Encounter for general adult medical examination without abnormal findings: Secondary | ICD-10-CM

## 2023-08-28 DIAGNOSIS — Z7189 Other specified counseling: Secondary | ICD-10-CM

## 2023-08-28 LAB — LIPID PANEL
Cholesterol: 238 mg/dL — ABNORMAL HIGH (ref 0–200)
HDL: 63.7 mg/dL (ref >39.00)
LDL Cholesterol: 145 mg/dL — ABNORMAL HIGH (ref 0–99)
NonHDL: 173.96
Total CHOL/HDL Ratio: 4
Triglycerides: 146 mg/dL (ref 0.0–149.0)
VLDL: 29.2 mg/dL (ref 0.0–40.0)

## 2023-08-28 LAB — COMPREHENSIVE METABOLIC PANEL
ALT: 26 U/L (ref 0–53)
AST: 22 U/L (ref 0–37)
Albumin: 4.1 g/dL (ref 3.5–5.2)
Alkaline Phosphatase: 34 U/L — ABNORMAL LOW (ref 39–117)
BUN: 19 mg/dL (ref 6–23)
CO2: 27 mEq/L (ref 19–32)
Calcium: 9 mg/dL (ref 8.4–10.5)
Chloride: 103 mEq/L (ref 96–112)
Creatinine, Ser: 0.78 mg/dL (ref 0.40–1.50)
GFR: 100.86 mL/min (ref >60.00)
Glucose, Bld: 95 mg/dL (ref 70–99)
Potassium: 4.2 mEq/L (ref 3.5–5.1)
Sodium: 138 mEq/L (ref 135–145)
Total Bilirubin: 0.3 mg/dL (ref 0.2–1.2)
Total Protein: 6.5 g/dL (ref 6.0–8.3)

## 2023-08-28 NOTE — Patient Instructions (Addendum)
Update me when you decide about stools cards, cologuard or colonoscopy.   Take care.  Glad to see you. Go to the lab on the way out.   If you have mychart we'll likely use that to update you.    I would get a flu shot each fall.   Check with your insurance to see if they will cover the shingles shot.

## 2023-08-28 NOTE — Progress Notes (Unsigned)
CPE- See plan.  Routine anticipatory guidance given to patient.  See health maintenance.  The possibility exists that previously documented standard health maintenance information may have been brought forward from a previous encounter into this note.  If needed, that same information has been updated to reflect the current situation based on today's encounter.    Tetanus 2017 Flu encouraged.  PNA not due.  Shingles d/w pt.   Covid vaccine d/w pt.   D/w patient [id number] for colon cancer screening, including IFOB vs. colonoscopy.  Risks and benefits of both were discussed and patient voiced understanding.  Pt elects to consider.   PSA screening not due.   Living will d/w pt.  Wife designated if patient were incapacitated.   H/o intentional weight loss. D/w pt about diet and exercise.  He has a healthy diet,  has been working on that for about 3 years.    L 5th finger improved in the meantime.  He buddy taped it after ER eval.    Discussed hearing.  Didn't want hearing aids.  Passed whisper hearing test.  He can update me as needed.    PMH and SH reviewed  Meds, vitals, and allergies reviewed.   ROS: Per HPI.  Unless specifically indicated otherwise in HPI, the patient denies:  General: fever. Eyes: acute vision changes ENT: sore throat Cardiovascular: chest pain Respiratory: SOB GI: vomiting GU: dysuria Musculoskeletal: acute back pain Derm: acute rash Neuro: acute motor dysfunction Psych: worsening mood Endocrine: polydipsia Heme: bleeding Allergy: hayfever  GEN: nad, alert and oriented HEENT: mucous membranes moist NECK: supple w/o LA CV: rrr. PULM: ctab, no inc wob ABD: soft, +bs EXT: no edema SKIN: no acute rash

## 2023-08-30 NOTE — Assessment & Plan Note (Signed)
Living will d/w pt.  Wife designated if patient were incapacitated.   ?

## 2023-08-30 NOTE — Assessment & Plan Note (Signed)
Tetanus 2017 Flu encouraged.  Discussed. PNA not due.  Shingles d/w pt.   Covid vaccine d/w pt. encouraged.   D/w patient [id number] for colon cancer screening, including IFOB vs. colonoscopy.  Risks and benefits of both were discussed and patient voiced understanding.  Pt elects to consider.  He can update me as needed. PSA screening not due.   Living will d/w pt.  Wife designated if patient were incapacitated.

## 2023-08-30 NOTE — Assessment & Plan Note (Signed)
H/o intentional weight loss. D/w pt about diet and exercise.  He has a healthy diet,  has been working on that for about 3 years.   See notes on labs.

## 2023-12-25 ENCOUNTER — Ambulatory Visit: Payer: Self-pay

## 2024-07-21 ENCOUNTER — Encounter (HOSPITAL_COMMUNITY): Payer: Self-pay | Admitting: *Deleted

## 2024-07-21 ENCOUNTER — Other Ambulatory Visit: Payer: Self-pay

## 2024-07-21 ENCOUNTER — Inpatient Hospital Stay (HOSPITAL_COMMUNITY)
Admission: EM | Admit: 2024-07-21 | Discharge: 2024-07-23 | DRG: 352 | Disposition: A | Discharge status: Home / Self Care | Attending: General Surgery | Admitting: General Surgery

## 2024-07-21 ENCOUNTER — Emergency Department (HOSPITAL_COMMUNITY)

## 2024-07-21 DIAGNOSIS — Z818 Family history of other mental and behavioral disorders: Secondary | ICD-10-CM

## 2024-07-21 DIAGNOSIS — Z8249 Family history of ischemic heart disease and other diseases of the circulatory system: Secondary | ICD-10-CM

## 2024-07-21 DIAGNOSIS — K56609 Unspecified intestinal obstruction, unspecified as to partial versus complete obstruction: Principal | ICD-10-CM

## 2024-07-21 DIAGNOSIS — K4090 Unilateral inguinal hernia, without obstruction or gangrene, not specified as recurrent: Secondary | ICD-10-CM

## 2024-07-21 DIAGNOSIS — K4030 Unilateral inguinal hernia, with obstruction, without gangrene, not specified as recurrent: Principal | ICD-10-CM | POA: Diagnosis present

## 2024-07-21 DIAGNOSIS — Z841 Family history of disorders of kidney and ureter: Secondary | ICD-10-CM

## 2024-07-21 DIAGNOSIS — Z87442 Personal history of urinary calculi: Secondary | ICD-10-CM

## 2024-07-21 DIAGNOSIS — Z888 Allergy status to other drugs, medicaments and biological substances status: Secondary | ICD-10-CM

## 2024-07-21 DIAGNOSIS — Z880 Allergy status to penicillin: Secondary | ICD-10-CM

## 2024-07-21 DIAGNOSIS — Z833 Family history of diabetes mellitus: Secondary | ICD-10-CM

## 2024-07-21 DIAGNOSIS — Z800 Family history of malignant neoplasm of digestive organs: Secondary | ICD-10-CM

## 2024-07-21 DIAGNOSIS — Z823 Family history of stroke: Secondary | ICD-10-CM

## 2024-07-21 LAB — COMPREHENSIVE METABOLIC PANEL WITH GFR
ALT: 18 U/L (ref 0–44)
AST: 22 U/L (ref 15–41)
Albumin: 3.6 g/dL (ref 3.5–5.0)
Alkaline Phosphatase: 35 U/L — ABNORMAL LOW (ref 38–126)
Anion gap: 9 (ref 5–15)
BUN: 19 mg/dL (ref 6–20)
CO2: 23 mmol/L (ref 22–32)
Calcium: 9 mg/dL (ref 8.9–10.3)
Chloride: 108 mmol/L (ref 98–111)
Creatinine, Ser: 0.84 mg/dL (ref 0.61–1.24)
GFR, Estimated: >60 mL/min (ref >60)
Glucose, Bld: 124 mg/dL — ABNORMAL HIGH (ref 70–99)
Potassium: 3.8 mmol/L (ref 3.5–5.1)
Sodium: 140 mmol/L (ref 135–145)
Total Bilirubin: 0.4 mg/dL (ref 0.0–1.2)
Total Protein: 6.2 g/dL — ABNORMAL LOW (ref 6.5–8.1)

## 2024-07-21 LAB — LIPASE, BLOOD: Lipase: 31 U/L (ref 11–51)

## 2024-07-21 LAB — URINALYSIS, ROUTINE W REFLEX MICROSCOPIC
Bilirubin Urine: NEGATIVE
Glucose, UA: NEGATIVE mg/dL
Hgb urine dipstick: NEGATIVE
Ketones, ur: NEGATIVE mg/dL
Leukocytes,Ua: NEGATIVE
Nitrite: NEGATIVE
Protein, ur: NEGATIVE mg/dL
Specific Gravity, Urine: 1.021 (ref 1.005–1.030)
pH: 5 (ref 5.0–8.0)

## 2024-07-21 LAB — CBC
HCT: 44.3 % (ref 39.0–52.0)
Hemoglobin: 14.7 g/dL (ref 13.0–17.0)
MCH: 30.7 pg (ref 26.0–34.0)
MCHC: 33.2 g/dL (ref 30.0–36.0)
MCV: 92.5 fL (ref 80.0–100.0)
Platelets: 289 K/uL (ref 150–400)
RBC: 4.79 MIL/uL (ref 4.22–5.81)
RDW: 12.1 % (ref 11.5–15.5)
WBC: 6.9 K/uL (ref 4.0–10.5)
nRBC: 0 % (ref 0.0–0.2)

## 2024-07-21 MED ORDER — HYDROMORPHONE HCL 1 MG/ML IJ SOLN
1.0000 mg | Freq: Once | INTRAMUSCULAR | Status: AC
  Administered 2024-07-21: 1 mg via INTRAVENOUS
  Filled 2024-07-21: qty 1

## 2024-07-21 MED ORDER — METOCLOPRAMIDE HCL 5 MG/ML IJ SOLN
10.0000 mg | Freq: Once | INTRAMUSCULAR | Status: AC
  Administered 2024-07-21: 10 mg via INTRAVENOUS
  Filled 2024-07-21: qty 2

## 2024-07-21 MED ORDER — OXYCODONE-ACETAMINOPHEN 5-325 MG PO TABS
1.0000 | ORAL_TABLET | Freq: Once | ORAL | Status: AC
  Administered 2024-07-21: 1 via ORAL

## 2024-07-21 MED ORDER — OXYCODONE-ACETAMINOPHEN 5-325 MG PO TABS
ORAL_TABLET | ORAL | Status: AC
  Filled 2024-07-21: qty 1

## 2024-07-21 NOTE — ED Provider Notes (Signed)
 Johnathan [name] EMERGENCY DEPARTMENT AT [facility] Provider Note   CSN: [id number] Arrival date & time: 07/21/24  1951     Patient presents with: Groin Swelling   Johnathan [name] is a 58 y.o. male.  {Add pertinent medical, surgical, social history, OB history to HPI:4666} 58 year old male presents with complaint of right groin knot with abdominal pain onset yesterday after heavy lifting. States he tried to push on the knot to get it to go down unsuccessfully. Pain progressively worsening which prompted ER evaluation today. No changes in bowel or bladder habits, vomiting. No prior abdominal surgeries.        Prior to Admission medications   Medication Sig Start Date End Date Taking? Authorizing Provider  ibuprofen  (ADVIL ) 200 MG tablet Take 200 mg by mouth every 6 (six) hours as needed.    [provider]  Omega 3-6-9 Fatty Acids (OMEGA-3-6-9 PO) Take 1 tablet by mouth 2 (two) times daily.     [provider]    Allergies: Penicillins and Zofran  [ondansetron  hcl]    Review of Systems Negative except as per HPI Updated Vital Signs BP (!) 140/91 (BP Location: Right Arm)   Pulse 60   Temp 98.4 F (36.9 C) (Oral)   Resp (!) 22   Ht 5' 5 (1.651 m)   Wt 80.3 kg   SpO2 99%   BMI 29.46 kg/m   Physical Exam Vitals and nursing note reviewed. Exam conducted with a chaperone present.  Constitutional:      General: He is not in acute distress.    Appearance: He is well-developed. He is not diaphoretic.  HENT:     Head: Normocephalic and atraumatic.  Pulmonary:     Effort: Pulmonary effort is normal.  Abdominal:     Palpations: Abdomen is soft.     Tenderness: There is generalized abdominal tenderness. There is guarding. There is no rebound.     Hernia: A hernia is present. Hernia is present in the right inguinal area.     Comments: Mild generalized abdominal pain Tender, soft mass over right inguinal area  Skin:    General: Skin is warm and dry.      Findings: No erythema or rash.  Neurological:     Mental Status: He is alert and oriented to person, place, and time.  Psychiatric:        Behavior: Behavior normal.     (all labs ordered are listed, but only abnormal results are displayed) Labs Reviewed  COMPREHENSIVE METABOLIC PANEL WITH GFR - Abnormal; Notable for the following components:      Result Value   Glucose, Bld 124 (*)    Total Protein 6.2 (*)    Alkaline Phosphatase 35 (*)    All other components within normal limits  URINALYSIS, ROUTINE W REFLEX MICROSCOPIC - Abnormal; Notable for the following components:   APPearance HAZY (*)    All other components within normal limits  LIPASE, BLOOD  CBC    EKG: None  Radiology: US  SCROTUM W/DOPPLER Result Date: 07/21/2024 CLINICAL DATA:  Right colon and testicular pain. Palpable lump the right groin area. EXAM: SCROTAL ULTRASOUND DOPPLER ULTRASOUND OF THE TESTICLES TECHNIQUE: Complete ultrasound examination of the testicles, epididymis, and other scrotal structures was performed. Color and spectral Doppler ultrasound were also utilized to evaluate blood flow to the testicles. COMPARISON:  None Available. FINDINGS: Right testicle Measurements: 4.9 x 2.6 x 3.2 cm. Symmetric and homogeneous echotexture without focal lesion. Patent arterial and venous  blood flow. Left testicle Measurements: 4.4 x 2.8 x 3.0 cm. Symmetric and homogeneous echotexture without focal lesion. Patent arterial and venous blood flow. Right epididymis:  Normal in size and appearance. Left epididymis:  Normal in size and appearance. Hydrocele: Small/moderate bilateral hydroceles, left greater than right. Varicocele:  None visualized. Pulsed Doppler interrogation of both testes demonstrates normal low resistance arterial and venous waveforms bilaterally. Other: The patient's palpable abnormality in the right inguinal area corresponds to a fluid collection with some internal debris or possible bowel loop. Suspect  inguinal hernia containing small bowel. CT scan suggested for further evaluation. IMPRESSION: 1. Normal sonographic appearance of both testicles. 2. Small/moderate bilateral hydroceles, left greater than right. 3. The patient's palpable abnormality in the right inguinal area corresponds to a fluid collection with some internal debris or possible bowel loop. Suspect inguinal hernia containing small bowel. CT scan suggested for further evaluation. Electronically Signed   By: MYRTIS Stammer M.D.   On: 07/21/2024 22:57    {Document cardiac monitor, telemetry assessment procedure when appropriate:32947} Procedures   Medications Ordered in the ED  HYDROmorphone  (DILAUDID ) injection 1 mg (has no administration in time range)  metoCLOPramide  (REGLAN ) injection 10 mg (has no administration in time range)  oxyCODONE -acetaminophen  (PERCOCET/ROXICET) 5-325 MG per tablet 1 tablet (1 tablet Oral Given 07/21/24 2007)      {Click here for ABCD2, HEART and other calculators REFRESH Note before signing:1}                              Medical Decision Making  This patient presents to the ED for concern of right groin pain, this involves an extensive number of treatment options, and is a complaint that carries with it a high risk of complications and morbidity.  The differential diagnosis includes but not limited to hernia, mass, abscess    Co morbidities / Chronic conditions that complicate the patient evaluation  Kidney stones, gout, hiatal hernia    Additional history obtained:  Additional history obtained from EMR External records from outside source obtained and reviewed including ***   Lab Tests:  I Ordered, and personally interpreted labs.  The pertinent results include:  ***   Imaging Studies ordered:  I ordered imaging studies including ***  I independently visualized and interpreted imaging which showed *** I agree with the radiologist interpretation   Cardiac Monitoring: / EKG:  The  patient was maintained on a cardiac monitor.  I personally viewed and interpreted the cardiac monitored which showed an underlying rhythm of: ***   Problem List / ED Course / Critical interventions / Medication management  58 yo male with pain in right groin with palpable mass, present after heavy lifting, does not reduce lying supine or with gentle pressure. Patient left in room supine with ice pack, pain meds ordered, will attempt reduction again after medicated.  I ordered medication including ***   Reevaluation of the patient after these medicines showed that the patient *** I have reviewed the patients home medicines and have made adjustments as needed   Consultations Obtained:  I requested consultation with the ***,  and discussed lab and imaging findings as well as pertinent plan - they recommend: ***   Social Determinants of Health:  ***   Test / Admission - Considered:  ***   {Document critical care time when appropriate  Document review of labs and clinical decision tools ie CHADS2VASC2, etc  Document your independent review of  radiology images and any outside records  Document your discussion with family members, caretakers and with consultants  Document social determinants of health affecting pt's care  Document your decision making why or why not admission, treatments were needed:32947:::1}   Final diagnoses:  None    ED Discharge Orders     None

## 2024-07-21 NOTE — ED Triage Notes (Signed)
 The pt has abd pain when the pain in his testicle is worse

## 2024-07-21 NOTE — ED Triage Notes (Signed)
 The pt is c/o a pain in his rt testicle since yesterday no difficulty voiding no temp

## 2024-07-22 ENCOUNTER — Emergency Department (HOSPITAL_COMMUNITY): Admitting: Certified Registered Nurse Anesthetist

## 2024-07-22 ENCOUNTER — Encounter (HOSPITAL_COMMUNITY): Payer: Self-pay | Admitting: Certified Registered Nurse Anesthetist

## 2024-07-22 ENCOUNTER — Emergency Department (HOSPITAL_COMMUNITY)

## 2024-07-22 ENCOUNTER — Encounter (HOSPITAL_COMMUNITY): Admission: EM | Disposition: A | Discharge status: Home / Self Care | Payer: Self-pay | Source: Home / Self Care

## 2024-07-22 ENCOUNTER — Other Ambulatory Visit: Payer: Self-pay

## 2024-07-22 DIAGNOSIS — K56609 Unspecified intestinal obstruction, unspecified as to partial versus complete obstruction: Secondary | ICD-10-CM | POA: Diagnosis present

## 2024-07-22 DIAGNOSIS — Z880 Allergy status to penicillin: Secondary | ICD-10-CM | POA: Diagnosis not present

## 2024-07-22 DIAGNOSIS — K4030 Unilateral inguinal hernia, with obstruction, without gangrene, not specified as recurrent: Secondary | ICD-10-CM | POA: Diagnosis present

## 2024-07-22 DIAGNOSIS — Z841 Family history of disorders of kidney and ureter: Secondary | ICD-10-CM | POA: Diagnosis not present

## 2024-07-22 DIAGNOSIS — Z823 Family history of stroke: Secondary | ICD-10-CM | POA: Diagnosis not present

## 2024-07-22 DIAGNOSIS — Z8249 Family history of ischemic heart disease and other diseases of the circulatory system: Secondary | ICD-10-CM | POA: Diagnosis not present

## 2024-07-22 DIAGNOSIS — Z87442 Personal history of urinary calculi: Secondary | ICD-10-CM | POA: Diagnosis not present

## 2024-07-22 DIAGNOSIS — K4090 Unilateral inguinal hernia, without obstruction or gangrene, not specified as recurrent: Secondary | ICD-10-CM

## 2024-07-22 DIAGNOSIS — Z818 Family history of other mental and behavioral disorders: Secondary | ICD-10-CM | POA: Diagnosis not present

## 2024-07-22 DIAGNOSIS — Z833 Family history of diabetes mellitus: Secondary | ICD-10-CM | POA: Diagnosis not present

## 2024-07-22 DIAGNOSIS — Z800 Family history of malignant neoplasm of digestive organs: Secondary | ICD-10-CM | POA: Diagnosis not present

## 2024-07-22 DIAGNOSIS — Z888 Allergy status to other drugs, medicaments and biological substances status: Secondary | ICD-10-CM | POA: Diagnosis not present

## 2024-07-22 HISTORY — PX: LAPAROSCOPY: SHX197

## 2024-07-22 HISTORY — PX: INGUINAL HERNIA REPAIR: SHX194

## 2024-07-22 SURGERY — LAPAROSCOPY, DIAGNOSTIC
Anesthesia: General | Laterality: Right

## 2024-07-22 MED ORDER — IOHEXOL 350 MG/ML SOLN
75.0000 mL | Freq: Once | INTRAVENOUS | Status: AC | PRN
  Administered 2024-07-22: 75 mL via INTRAVENOUS

## 2024-07-22 MED ORDER — LIDOCAINE 2% (20 MG/ML) 5 ML SYRINGE
INTRAMUSCULAR | Status: DC | PRN
  Administered 2024-07-22: 60 mg via INTRAVENOUS

## 2024-07-22 MED ORDER — CEFAZOLIN SODIUM-DEXTROSE 2-4 GM/100ML-% IV SOLN
INTRAVENOUS | Status: AC
  Filled 2024-07-22: qty 100

## 2024-07-22 MED ORDER — POLYETHYLENE GLYCOL 3350 17 G PO PACK: 17.0000 g | PACK | Freq: Every day | ORAL | Status: DC | PRN

## 2024-07-22 MED ORDER — DIPHENHYDRAMINE HCL 50 MG/ML IJ SOLN
INTRAMUSCULAR | Status: AC
  Filled 2024-07-22: qty 1

## 2024-07-22 MED ORDER — PROPOFOL 10 MG/ML IV BOLUS
INTRAVENOUS | Status: AC
  Filled 2024-07-22: qty 20

## 2024-07-22 MED ORDER — LIDOCAINE 2% (20 MG/ML) 5 ML SYRINGE
INTRAMUSCULAR | Status: AC
  Filled 2024-07-22: qty 5

## 2024-07-22 MED ORDER — ONDANSETRON HCL 4 MG/2ML IJ SOLN
INTRAMUSCULAR | Status: AC
Start: 2024-07-22 — End: 2024-07-22
  Filled 2024-07-22: qty 2

## 2024-07-22 MED ORDER — SUCCINYLCHOLINE CHLORIDE 200 MG/10ML IV SOSY
PREFILLED_SYRINGE | INTRAVENOUS | Status: AC
  Filled 2024-07-22: qty 10

## 2024-07-22 MED ORDER — SUGAMMADEX SODIUM 200 MG/2ML IV SOLN
INTRAVENOUS | Status: DC | PRN
  Administered 2024-07-22: 200 mg via INTRAVENOUS

## 2024-07-22 MED ORDER — BUPIVACAINE-EPINEPHRINE (PF) 0.25% -1:200000 IJ SOLN
INTRAMUSCULAR | Status: AC
Start: 2024-07-22 — End: 2024-07-22
  Filled 2024-07-22: qty 30

## 2024-07-22 MED ORDER — DEXAMETHASONE SODIUM PHOSPHATE 10 MG/ML IJ SOLN
INTRAMUSCULAR | Status: DC | PRN
  Administered 2024-07-22: 10 mg via INTRAVENOUS

## 2024-07-22 MED ORDER — DEXAMETHASONE SODIUM PHOSPHATE 10 MG/ML IJ SOLN
INTRAMUSCULAR | Status: AC
Start: 2024-07-22 — End: 2024-07-22
  Filled 2024-07-22: qty 1

## 2024-07-22 MED ORDER — LACTATED RINGERS IV SOLN: INTRAVENOUS | Status: DC | PRN

## 2024-07-22 MED ORDER — ROCURONIUM BROMIDE 10 MG/ML (PF) SYRINGE
PREFILLED_SYRINGE | INTRAVENOUS | Status: AC
  Filled 2024-07-22: qty 10

## 2024-07-22 MED ORDER — PHENYLEPHRINE 80 MCG/ML (10ML) SYRINGE FOR IV PUSH (FOR BLOOD PRESSURE SUPPORT)
PREFILLED_SYRINGE | INTRAVENOUS | Status: AC
  Filled 2024-07-22: qty 10

## 2024-07-22 MED ORDER — SCOPOLAMINE 1 MG/3DAYS TD PT72
MEDICATED_PATCH | TRANSDERMAL | Status: DC | PRN
  Administered 2024-07-22: 1 via TRANSDERMAL

## 2024-07-22 MED ORDER — ACETAMINOPHEN 10 MG/ML IV SOLN
INTRAVENOUS | Status: AC
Start: 2024-07-22 — End: 2024-07-22
  Filled 2024-07-22: qty 100

## 2024-07-22 MED ORDER — EPHEDRINE 5 MG/ML INJ
INTRAVENOUS | Status: AC
  Filled 2024-07-22: qty 5

## 2024-07-22 MED ORDER — AMISULPRIDE (ANTIEMETIC) 5 MG/2ML IV SOLN: 10.0000 mg | Freq: Once | INTRAVENOUS | Status: DC | PRN

## 2024-07-22 MED ORDER — DIPHENHYDRAMINE HCL 50 MG/ML IJ SOLN
INTRAMUSCULAR | Status: DC | PRN
  Administered 2024-07-22: 12.5 mg via INTRAVENOUS

## 2024-07-22 MED ORDER — 0.9 % SODIUM CHLORIDE (POUR BTL) OPTIME
TOPICAL | Status: DC | PRN
  Administered 2024-07-22: 1000 mL

## 2024-07-22 MED ORDER — CHLORHEXIDINE GLUCONATE 4 % EX SOLN
1.0000 | Freq: Once | CUTANEOUS | Status: DC
  Filled 2024-07-22: qty 15

## 2024-07-22 MED ORDER — FENTANYL CITRATE (PF) 250 MCG/5ML IJ SOLN
INTRAMUSCULAR | Status: AC
  Filled 2024-07-22: qty 5

## 2024-07-22 MED ORDER — ENOXAPARIN SODIUM 40 MG/0.4ML IJ SOSY
40.0000 mg | PREFILLED_SYRINGE | INTRAMUSCULAR | Status: DC
  Administered 2024-07-23: 40 mg via SUBCUTANEOUS
  Filled 2024-07-22: qty 0.4

## 2024-07-22 MED ORDER — CEFAZOLIN SODIUM-DEXTROSE 2-4 GM/100ML-% IV SOLN
2.0000 g | INTRAVENOUS | Status: AC
  Administered 2024-07-22: 2 g via INTRAVENOUS

## 2024-07-22 MED ORDER — DOCUSATE SODIUM 100 MG PO CAPS
100.0000 mg | ORAL_CAPSULE | Freq: Two times a day (BID) | ORAL | Status: DC
  Administered 2024-07-22 – 2024-07-23 (×3): 100 mg via ORAL
  Filled 2024-07-22 (×3): qty 1

## 2024-07-22 MED ORDER — PROPOFOL 10 MG/ML IV BOLUS
INTRAVENOUS | Status: DC | PRN
  Administered 2024-07-22: 180 mg via INTRAVENOUS

## 2024-07-22 MED ORDER — ACETAMINOPHEN 10 MG/ML IV SOLN
INTRAVENOUS | Status: DC | PRN
  Administered 2024-07-22: 1000 mg via INTRAVENOUS

## 2024-07-22 MED ORDER — MIDAZOLAM HCL 2 MG/2ML IJ SOLN
INTRAMUSCULAR | Status: AC
  Filled 2024-07-22: qty 2

## 2024-07-22 MED ORDER — FENTANYL CITRATE (PF) 100 MCG/2ML IJ SOLN: 25.0000 ug | INTRAMUSCULAR | Status: DC | PRN

## 2024-07-22 MED ORDER — HYDROMORPHONE HCL 1 MG/ML IJ SOLN: 0.5000 mg | INTRAMUSCULAR | Status: DC | PRN

## 2024-07-22 MED ORDER — MIDAZOLAM HCL 2 MG/2ML IJ SOLN
INTRAMUSCULAR | Status: DC | PRN
  Administered 2024-07-22: 2 mg via INTRAVENOUS

## 2024-07-22 MED ORDER — ROCURONIUM BROMIDE 10 MG/ML (PF) SYRINGE
PREFILLED_SYRINGE | INTRAVENOUS | Status: DC | PRN
  Administered 2024-07-22: 20 mg via INTRAVENOUS
  Administered 2024-07-22: 50 mg via INTRAVENOUS

## 2024-07-22 MED ORDER — IBUPROFEN 400 MG PO TABS
400.0000 mg | ORAL_TABLET | Freq: Three times a day (TID) | ORAL | Status: DC
  Administered 2024-07-22 (×3): 400 mg via ORAL
  Filled 2024-07-22 (×6): qty 1

## 2024-07-22 MED ORDER — OXYCODONE HCL 5 MG PO TABS
5.0000 mg | ORAL_TABLET | ORAL | Status: DC | PRN
  Administered 2024-07-22 – 2024-07-23 (×4): 5 mg via ORAL
  Filled 2024-07-22 (×4): qty 1

## 2024-07-22 MED ORDER — ONDANSETRON HCL 4 MG/2ML IJ SOLN: 4.0000 mg | Freq: Four times a day (QID) | INTRAMUSCULAR | Status: DC | PRN

## 2024-07-22 MED ORDER — BUPIVACAINE-EPINEPHRINE (PF) 0.25% -1:200000 IJ SOLN
INTRAMUSCULAR | Status: DC | PRN
  Administered 2024-07-22: 30 mL

## 2024-07-22 MED ORDER — DIPHENHYDRAMINE HCL 25 MG PO CAPS: 25.0000 mg | ORAL_CAPSULE | Freq: Four times a day (QID) | ORAL | Status: DC | PRN

## 2024-07-22 MED ORDER — ONDANSETRON 4 MG PO TBDP
4.0000 mg | ORAL_TABLET | Freq: Four times a day (QID) | ORAL | Status: DC | PRN
Start: 2024-07-22 — End: 2024-07-23

## 2024-07-22 MED ORDER — PHENYLEPHRINE 80 MCG/ML (10ML) SYRINGE FOR IV PUSH (FOR BLOOD PRESSURE SUPPORT)
PREFILLED_SYRINGE | INTRAVENOUS | Status: DC | PRN
  Administered 2024-07-22 (×2): 160 ug via INTRAVENOUS

## 2024-07-22 MED ORDER — HYDROMORPHONE HCL 1 MG/ML IJ SOLN
1.0000 mg | Freq: Once | INTRAMUSCULAR | Status: AC
  Administered 2024-07-22: 1 mg via INTRAVENOUS
  Filled 2024-07-22: qty 1

## 2024-07-22 MED ORDER — METHOCARBAMOL 500 MG PO TABS
500.0000 mg | ORAL_TABLET | Freq: Three times a day (TID) | ORAL | Status: DC
  Administered 2024-07-22 – 2024-07-23 (×4): 500 mg via ORAL
  Filled 2024-07-22 (×4): qty 1

## 2024-07-22 MED ORDER — KETOROLAC TROMETHAMINE 30 MG/ML IJ SOLN
INTRAMUSCULAR | Status: AC
  Filled 2024-07-22: qty 1

## 2024-07-22 MED ORDER — EPHEDRINE SULFATE-NACL 50-0.9 MG/10ML-% IV SOSY
PREFILLED_SYRINGE | INTRAVENOUS | Status: DC | PRN
  Administered 2024-07-22 (×2): 5 mg via INTRAVENOUS

## 2024-07-22 MED ORDER — KETOROLAC TROMETHAMINE 30 MG/ML IJ SOLN
INTRAMUSCULAR | Status: DC | PRN
  Administered 2024-07-22: 30 mg via INTRAVENOUS

## 2024-07-22 MED ORDER — SCOPOLAMINE 1 MG/3DAYS TD PT72
MEDICATED_PATCH | TRANSDERMAL | Status: AC
  Filled 2024-07-22: qty 1

## 2024-07-22 MED ORDER — SUCCINYLCHOLINE CHLORIDE 200 MG/10ML IV SOSY
PREFILLED_SYRINGE | INTRAVENOUS | Status: DC | PRN
  Administered 2024-07-22: 120 mg via INTRAVENOUS

## 2024-07-22 MED ORDER — ACETAMINOPHEN 500 MG PO TABS
1000.0000 mg | ORAL_TABLET | Freq: Four times a day (QID) | ORAL | Status: DC
  Administered 2024-07-22 – 2024-07-23 (×4): 1000 mg via ORAL
  Filled 2024-07-22 (×5): qty 2

## 2024-07-22 MED ORDER — FENTANYL CITRATE (PF) 250 MCG/5ML IJ SOLN
INTRAMUSCULAR | Status: DC | PRN
  Administered 2024-07-22: 100 ug via INTRAVENOUS

## 2024-07-22 MED ORDER — DIPHENHYDRAMINE HCL 50 MG/ML IJ SOLN: 25.0000 mg | Freq: Four times a day (QID) | INTRAMUSCULAR | Status: DC | PRN

## 2024-07-22 SURGICAL SUPPLY — 39 items
BAG COUNTER SPONGE SURGICOUNT (BAG) ×1 IMPLANT
BLADE CLIPPER SURG (BLADE) IMPLANT
CANISTER SUCTION 3000ML PPV (SUCTIONS) IMPLANT
CHLORAPREP W/TINT 26 (MISCELLANEOUS) ×1 IMPLANT
COVER SURGICAL LIGHT HANDLE (MISCELLANEOUS) ×1 IMPLANT
DERMABOND ADVANCED .7 DNX12 (GAUZE/BANDAGES/DRESSINGS) ×1 IMPLANT
DRAPE LAPAROSCOPIC ABDOMINAL (DRAPES) IMPLANT
DRAPE LAPAROTOMY 100X72 PEDS (DRAPES) ×1 IMPLANT
ELECTRODE REM PT RTRN 9FT ADLT (ELECTROSURGICAL) ×1 IMPLANT
GLOVE BIOGEL PI IND STRL 6 (GLOVE) ×1 IMPLANT
GLOVE BIOGEL PI MICRO STRL 5.5 (GLOVE) ×1 IMPLANT
GOWN STRL REUS W/ TWL LRG LVL3 (GOWN DISPOSABLE) ×2 IMPLANT
IRRIGATION SUCT STRKRFLW 2 WTP (MISCELLANEOUS) IMPLANT
KIT BASIN OR (CUSTOM PROCEDURE TRAY) ×1 IMPLANT
KIT TURNOVER KIT B (KITS) ×1 IMPLANT
MANIFOLD NEPTUNE II (INSTRUMENTS) IMPLANT
MESH ULTRAPRO 3X6 7.6X15CM (Mesh General) IMPLANT
NDL HYPO 25GX1X1/2 BEV (NEEDLE) ×1 IMPLANT
NDL INSUFFLATION 14GA 120MM (NEEDLE) ×1 IMPLANT
NEEDLE HYPO 25GX1X1/2 BEV (NEEDLE) ×1 IMPLANT
NEEDLE INSUFFLATION 14GA 120MM (NEEDLE) ×1 IMPLANT
NS IRRIG 1000ML POUR BTL (IV SOLUTION) ×1 IMPLANT
PACK GENERAL/GYN (CUSTOM PROCEDURE TRAY) ×1 IMPLANT
PAD ARMBOARD POSITIONER FOAM (MISCELLANEOUS) ×2 IMPLANT
SCISSORS LAP 5X35 DISP (ENDOMECHANICALS) IMPLANT
SET TUBE SMOKE EVAC HIGH FLOW (TUBING) ×1 IMPLANT
SLEEVE Z-THREAD 5X100MM (TROCAR) ×1 IMPLANT
SUT MNCRL AB 4-0 PS2 18 (SUTURE) ×1 IMPLANT
SUT NOVA NAB DX-16 0-1 5-0 T12 (SUTURE) ×1 IMPLANT
SUT NOVA NAB GS-21 0 18 T12 DT (SUTURE) ×1 IMPLANT
SUT PDS AB 0 CT1 27 (SUTURE) IMPLANT
SUT VIC AB 3-0 SH 27X BRD (SUTURE) ×1 IMPLANT
SYR CONTROL 10ML LL (SYRINGE) ×1 IMPLANT
TOWEL GREEN STERILE (TOWEL DISPOSABLE) ×1 IMPLANT
TOWEL GREEN STERILE FF (TOWEL DISPOSABLE) ×1 IMPLANT
TRAY LAPAROSCOPIC MC (CUSTOM PROCEDURE TRAY) ×1 IMPLANT
TROCAR BALLN 12MMX100 BLUNT (TROCAR) IMPLANT
TROCAR Z-THREAD OPTICAL 5X100M (TROCAR) ×1 IMPLANT
WARMER LAPAROSCOPE (MISCELLANEOUS) ×1 IMPLANT

## 2024-07-22 NOTE — Anesthesia Procedure Notes (Signed)
 Procedure Name: Intubation Date/Time: 07/22/2024 3:15 AM  Performed by: Roddie Grate, CRNAPre-anesthesia Checklist: Patient identified, Emergency Drugs available, Suction available, Patient being monitored and Timeout performed Patient Re-evaluated:Patient Re-evaluated prior to induction Oxygen Delivery Method: Circle system utilized Preoxygenation: Pre-oxygenation with 100% oxygen Induction Type: IV induction, Rapid sequence and Cricoid Pressure applied Laryngoscope Size: Mac and 4 Grade View: Grade I Tube type: Oral Tube size: 7.5 mm Number of attempts: 1 Airway Equipment and Method: Stylet Placement Confirmation: ETT inserted through vocal cords under direct vision, positive ETCO2 and breath sounds checked- equal and bilateral Secured at: 23 cm Tube secured with: Tape Dental Injury: Teeth and Oropharynx as per pre-operative assessment  Comments: Smooth IV Induction. Eyes taped. RSI Performed. DL x 1 with grade 1 view. Atraumatically placed, teeth and lip remain intact as pre-op. Secured with tape. Bilateral breath sounds +/=, EtCO2 +, Adequate TV, VSS.

## 2024-07-22 NOTE — H&P (Signed)
 Johnathan [name] Jul 01, 1966  [id number].     HPI:  Johnathan [name] is a 58 yo male who presented to the ED with pain in the right groin and scrotum. It began yesterday and has gotten progressively worse. Today he also started having abdominal pain and nausea, but denies vomiting. He reports he is still having bowel function. In the ED he initially had a scrotal US , which showed normal appearances of both testicles but was suspicious for an inguinal hernia. The hernia was not able to be reduced. A CT scan was then done and showed a right inguinal hernia containing a loop of small bowel. General surgery was consulted.  The patient has not had any prior abdominal surgeries. He does not take any blood thinners and does not have any known cardiopulmonary disease. He does not smoke.  ROS: Review of Systems  Constitutional:  Negative for chills and fever.  Respiratory:  Negative for shortness of breath.   Gastrointestinal:  Positive for abdominal pain and nausea. Negative for vomiting.    Family History  Problem Relation Age of Onset   Dementia Mother    Diabetes Mother    Heart disease Father    Depression Father        manic   Hypertension Father    CVA Father    Kidney disease Sister    Cancer Brother    Pancreatic cancer Brother    Colon cancer Neg Hx    Prostate cancer Neg Hx     Past Medical History:  Diagnosis Date   Family history of adverse reaction to anesthesia    daughter had PONV   History of gout    feet   History of hiatal hernia    Kidney stones    Laceration 1985   left forearm arterial laceration   Pneumonia 2017   PONV (postoperative nausea and vomiting)     Past Surgical History:  Procedure Laterality Date   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;  Dr.Carter   EYE SURGERY     GAS/FLUID EXCHANGE Left 10/25/2016   Procedure: GAS/FLUID EXCHANGE;  Surgeon: Norleen JONETTA Ku, MD;  Location: [facility] OR;  Service: Ophthalmology;   Laterality: Left;   KIDNEY STONE SURGERY     LITHOTRIPSY     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   with gas, cryo and bubble    PARS PLANA VITRECTOMY Left 06/14/2016   Procedure: PARS PLANA VITRECTOMY WITH 25 GAUGE with Laser and Gas injection.;  Surgeon: Norleen JONETTA Ku, MD;  Location: [facility] OR;  Service: Ophthalmology;  Laterality: Left;   PHOTOCOAGULATION WITH LASER Left 10/25/2016   Procedure: PHOTOCOAGULATION WITH LASER;  Surgeon: Norleen JONETTA Ku, MD;  Location: [facility] OR;  Service: Ophthalmology;  Laterality: Left;   SCLERAL BUCKLE WITH CRYO Left 10/25/2016   Procedure: SCLERAL BUCKLE WITH CRYO;  Surgeon: Norleen JONETTA Ku, MD;  Location: [facility] OR;  Service: Ophthalmology;  Laterality: Left;   VARICOSE VEIN SURGERY Left 08/05/1989   thigh, Dr.Humphreys    Social History:  reports that he has never smoked. He has never used smokeless tobacco. He reports current alcohol use. He reports that he does not use drugs.  Allergies:  Allergies  Allergen Reactions   Penicillins Hives and Rash     Has patient had a PCN reaction causing immediate rash, facial/tongue/throat swelling, SOB or lightheadedness with hypotension:  # # YES # #  Has patient had a PCN reaction causing severe rash  involving mucus membranes or skin necrosis: No Has patient had a PCN reaction that required hospitalization No Has patient had a PCN reaction occurring within the last 10 years: No If all of the above answers are NO, then may proceed with Cephalosporin use.    Zofran  [Ondansetron  Hcl] Nausea And Vomiting and Other (See Comments)    PATIENT PREFERENCE -- DOES NOT HELP    (Not in a hospital admission)    Physical Exam: Blood pressure 117/73, pulse (!) 50, temperature 98.1 F (36.7 C), temperature source Oral, resp. rate 14, height 5' 5 (1.651 m), weight 80.3 kg, SpO2 98%. General: resting comfortably, mildly lethargic, NAD Neurological: mildly lethargic but rouses easily and answers  questions appropriately HEENT: normocephalic, atraumatic CV: regular rate and rhythm Respiratory: normal work of breathing on room air Abdomen: soft, nondistended, nontender. No surgical scars. GU: right inguinal hernia, tender to palpation, no overlying skin changes. Not reducible on multiple attempts in the supine position. Extremities: warm and well-perfused, no deformities, moving all extremities spontaneously Skin: warm and dry, no jaundice, no rashes or lesions   Results for orders placed or performed during the hospital encounter of 07/21/24 (from the past 48 hours)  Lipase, blood     Status: None   Collection Time: 07/21/24  8:09 PM  Result Value Ref Range   Lipase 31 11 - 51 U/L    Comment: Performed at [facility], [address] [place], [place] [address]  Comprehensive metabolic panel     Status: Abnormal   Collection Time: 07/21/24  8:09 PM  Result Value Ref Range   Sodium 140 135 - 145 mmol/L   Potassium 3.8 3.5 - 5.1 mmol/L   Chloride 108 98 - 111 mmol/L   CO2 23 22 - 32 mmol/L   Glucose, Bld 124 (H) 70 - 99 mg/dL    Comment: Glucose reference range applies only to samples taken after fasting for at least 8 hours.   BUN 19 6 - 20 mg/dL   Creatinine, Ser 9.15 0.61 - 1.24 mg/dL   Calcium 9.0 8.9 - 89.6 mg/dL   Total Protein 6.2 (L) 6.5 - 8.1 g/dL   Albumin 3.6 3.5 - 5.0 g/dL   AST 22 15 - 41 U/L   ALT 18 0 - 44 U/L   Alkaline Phosphatase 35 (L) 38 - 126 U/L   Total Bilirubin 0.4 0.0 - 1.2 mg/dL   GFR, Estimated >39 >39 mL/min    Comment: (NOTE) Calculated using the CKD-EPI Creatinine Equation (2021)    Anion gap 9 5 - 15    Comment: Performed at [facility], [address] [place], [place] [address]  CBC     Status: None   Collection Time: 07/21/24  8:09 PM  Result Value Ref Range   WBC 6.9 4.0 - 10.5 K/uL   RBC 4.79 4.22 - 5.81 MIL/uL   Hemoglobin 14.7 13.0 - 17.0 g/dL   HCT 55.6 60.9 - 47.9 %   MCV 92.5 80.0 - 100.0 fL   MCH 30.7 26.0 -  34.0 pg   MCHC 33.2 30.0 - 36.0 g/dL   RDW 87.8 88.4 - 84.4 %   Platelets 289 150 - 400 K/uL   nRBC 0.0 0.0 - 0.2 %    Comment: Performed at [facility], [address] [place], [place] [address]  Urinalysis, Routine w reflex microscopic -Urine, Clean Catch     Status: Abnormal   Collection Time: 07/21/24  8:16  PM  Result Value Ref Range   Color, Urine YELLOW YELLOW   APPearance HAZY (A) CLEAR   Specific Gravity, Urine 1.021 1.005 - 1.030   pH 5.0 5.0 - 8.0   Glucose, UA NEGATIVE NEGATIVE mg/dL   Hgb urine dipstick NEGATIVE NEGATIVE   Bilirubin Urine NEGATIVE NEGATIVE   Ketones, ur NEGATIVE NEGATIVE mg/dL   Protein, ur NEGATIVE NEGATIVE mg/dL   Nitrite NEGATIVE NEGATIVE   Leukocytes,Ua NEGATIVE NEGATIVE    Comment: Performed at [facility], [address], [place], [place] [address]   US  SCROTUM W/DOPPLER Result Date: 07/21/2024 CLINICAL DATA:  Right colon and testicular pain. Palpable lump the right groin area. EXAM: SCROTAL ULTRASOUND DOPPLER ULTRASOUND OF THE TESTICLES TECHNIQUE: Complete ultrasound examination of the testicles, epididymis, and other scrotal structures was performed. Color and spectral Doppler ultrasound were also utilized to evaluate blood flow to the testicles. COMPARISON:  None Available. FINDINGS: Right testicle Measurements: 4.9 x 2.6 x 3.2 cm. Symmetric and homogeneous echotexture without focal lesion. Patent arterial and venous blood flow. Left testicle Measurements: 4.4 x 2.8 x 3.0 cm. Symmetric and homogeneous echotexture without focal lesion. Patent arterial and venous blood flow. Right epididymis:  Normal in size and appearance. Left epididymis:  Normal in size and appearance. Hydrocele: Small/moderate bilateral hydroceles, left greater than right. Varicocele:  None visualized. Pulsed Doppler interrogation of both testes demonstrates normal low resistance arterial and venous waveforms bilaterally. Other: The patient's palpable abnormality in the  right inguinal area corresponds to a fluid collection with some internal debris or possible bowel loop. Suspect inguinal hernia containing small bowel. CT scan suggested for further evaluation. IMPRESSION: 1. Normal sonographic appearance of both testicles. 2. Small/moderate bilateral hydroceles, left greater than right. 3. The patient's palpable abnormality in the right inguinal area corresponds to a fluid collection with some internal debris or possible bowel loop. Suspect inguinal hernia containing small bowel. CT scan suggested for further evaluation. Electronically Signed   By: MYRTIS Stammer M.D.   On: 07/21/2024 22:57      Assessment/Plan 58 yo male with an incarcerated right inguinal hernia containing small bowel. He received a dose of dilaudid  shortly prior to my exam, and I was unable to reduce the hernia on multiple attempts. Will proceed to the OR for an open right inguinal hernia repair with mesh, and possibly a diagnostic laparoscopy if the hernia reduces prior to opening the hernia sac. I reviewed the procedure details with the patient, including the benefits and the risks of bleeding, infection, inguinodynia, and injury to the vas deferens. He expressed understanding and agrees to proceed with surgery. - NPO - Proceed to OR  - Admit to observation postoperatively   Leonor Dawn, MD [facility] General, Hepatobiliary and Pancreatic Surgery 07/22/24 2:19 AM

## 2024-07-22 NOTE — Transfer of Care (Signed)
 Immediate Anesthesia Transfer of Care Note  Patient: Johnathan [name]  Procedure(s) Performed: LAPAROSCOPY, DIAGNOSTIC (Right) REPAIR, HERNIA, INGUINAL, INCARCERATED (Right)  Patient Location: PACU  Anesthesia Type:General  Level of Consciousness: drowsy  Airway & Oxygen Therapy: Patient Spontanous Breathing and Patient connected to face mask oxygen  Post-op Assessment: Report given to RN and Post -op Vital signs reviewed and stable  Post vital signs: Reviewed and stable  Last Vitals:  Vitals Value Taken Time  BP 120/77 07/22/24 05:06  Temp    Pulse 67 07/22/24 05:10  Resp 12 07/22/24 05:10  SpO2 94 % 07/22/24 05:10  Vitals shown include unfiled device data.  Last Pain:  Vitals:   07/22/24 0143  TempSrc: Oral  PainSc: 10-Worst pain ever      Patients Stated Pain Goal: 0 (07/22/24 0143)  Complications: No notable events documented.

## 2024-07-22 NOTE — Progress Notes (Signed)
 * Day of Surgery *  Subjective: Patient is resting comfortably in bed. NAD. Wife at bedside who reports that he has tolerating some CLD but has mostly been sleeping.  ROS: See above, otherwise other systems negative  Objective: Vital signs in last 24 hours: Temp:  [97.5 F (36.4 C)-98.4 F (36.9 C)] 97.5 F (36.4 C) (08/18 9391) Pulse Rate:  [50-62] 55 (08/18 0608) Resp:  [8-22] 16 (08/18 0608) BP: (112-140)/(73-91) 118/80 (08/18 0608) SpO2:  [91 %-100 %] 95 % (08/18 9391) Weight:  [80.3 kg] 80.3 kg (08/17 2000) Last BM Date : 07/21/24  Intake/Output from previous day: 08/17 0701 - 08/18 0700 In: 1900 [I.V.:1700; IV Piggyback:200] Out: 20 [Blood:20] Intake/Output this shift: No intake/output data recorded.  PE: Gen: male, NAD Abd: soft, non-distended, port site with dermabond intact, non-TTP R Groin: dermabond intact, no erythema, no bulge  Lab Results:  Recent Labs    07/21/24 2009  WBC 6.9  HGB 14.7  HCT 44.3  PLT 289   BMET Recent Labs    07/21/24 2009  NA 140  K 3.8  CL 108  CO2 23  GLUCOSE 124*  BUN 19  CREATININE 0.84  CALCIUM 9.0   PT/INR No results for input(s): LABPROT, INR in the last 72 hours. CMP     Component Value Date/Time   NA 140 07/21/2024 2009   K 3.8 07/21/2024 2009   CL 108 07/21/2024 2009   CO2 23 07/21/2024 2009   GLUCOSE 124 (H) 07/21/2024 2009   BUN 19 07/21/2024 2009   CREATININE 0.84 07/21/2024 2009   CALCIUM 9.0 07/21/2024 2009   PROT 6.2 (L) 07/21/2024 2009   ALBUMIN 3.6 07/21/2024 2009   AST 22 07/21/2024 2009   ALT 18 07/21/2024 2009   ALKPHOS 35 (L) 07/21/2024 2009   BILITOT 0.4 07/21/2024 2009   GFRNONAA >60 07/21/2024 2009   GFRAA >60 10/25/2016 1044   Lipase     Component Value Date/Time   LIPASE 31 07/21/2024 2009    Studies/Results: CT ABDOMEN PELVIS W CONTRAST Result Date: 07/22/2024 CLINICAL DATA:  Right-sided testicle pain EXAM: CT ABDOMEN AND PELVIS WITH CONTRAST TECHNIQUE:  Multidetector CT imaging of the abdomen and pelvis was performed using the standard protocol following bolus administration of intravenous contrast. RADIATION DOSE REDUCTION: This exam was performed according to the departmental dose-optimization program which includes automated exposure control, adjustment of the mA and/or kV according to patient size and/or use of iterative reconstruction technique. CONTRAST:  75mL OMNIPAQUE  IOHEXOL  350 MG/ML SOLN COMPARISON:  Ultrasound 07/21/2024, CT 09/21/2010 FINDINGS: Lower chest: Lung bases demonstrate no acute airspace disease. Hepatobiliary: No focal liver abnormality is seen. No gallstones, gallbladder wall thickening, or biliary dilatation. Pancreas: Unremarkable. No pancreatic ductal dilatation or surrounding inflammatory changes. Spleen: Normal in size without focal abnormality. Adrenals/Urinary Tract: Adrenal glands are normal. Kidneys show no hydronephrosis. Nonobstructing left kidney stone measuring 5 mm. The bladder is unremarkable Stomach/Bowel: The stomach is nonenlarged. Fluid-filled dilated mid to distal small bowel measuring up to 3.7 cm. Fecalized appearance of pelvic small bowel with associated mesenteric vascular congestion, transition point related to a right inguinal hernia containing fat and slightly dilated loops of small bowel. The small bowel/terminal ileum distal to the hernia is completely decompressed. Negative appendix. Fluid in the right scrotum. Diverticular disease of the colon without acute wall thickening. Vascular/Lymphatic: No significant vascular findings are present. No enlarged abdominal or pelvic lymph nodes. Reproductive: Prostate is unremarkable.  Small scrotal hydroceles. Other: Negative for pelvic  effusion or free air. Right inguinal hernia containing fat and obstructed small bowel loop. Musculoskeletal: No acute osseous abnormality IMPRESSION: 1. Findings consistent with small bowel obstruction secondary to incarcerated right  inguinal hernia containing fat and obstructed small bowel loop. Mild mesenteric vascular congestion of the obstructed distal small bowel. 2. Nonobstructing left kidney stone. Electronically Signed   By: Luke Bun M.D.   On: 07/22/2024 02:17   US  SCROTUM W/DOPPLER Result Date: 07/21/2024 CLINICAL DATA:  Right colon and testicular pain. Palpable lump the right groin area. EXAM: SCROTAL ULTRASOUND DOPPLER ULTRASOUND OF THE TESTICLES TECHNIQUE: Complete ultrasound examination of the testicles, epididymis, and other scrotal structures was performed. Color and spectral Doppler ultrasound were also utilized to evaluate blood flow to the testicles. COMPARISON:  None Available. FINDINGS: Right testicle Measurements: 4.9 x 2.6 x 3.2 cm. Symmetric and homogeneous echotexture without focal lesion. Patent arterial and venous blood flow. Left testicle Measurements: 4.4 x 2.8 x 3.0 cm. Symmetric and homogeneous echotexture without focal lesion. Patent arterial and venous blood flow. Right epididymis:  Normal in size and appearance. Left epididymis:  Normal in size and appearance. Hydrocele: Small/moderate bilateral hydroceles, left greater than right. Varicocele:  None visualized. Pulsed Doppler interrogation of both testes demonstrates normal low resistance arterial and venous waveforms bilaterally. Other: The patient's palpable abnormality in the right inguinal area corresponds to a fluid collection with some internal debris or possible bowel loop. Suspect inguinal hernia containing small bowel. CT scan suggested for further evaluation. IMPRESSION: 1. Normal sonographic appearance of both testicles. 2. Small/moderate bilateral hydroceles, left greater than right. 3. The patient's palpable abnormality in the right inguinal area corresponds to a fluid collection with some internal debris or possible bowel loop. Suspect inguinal hernia containing small bowel. CT scan suggested for further evaluation. Electronically Signed    By: MYRTIS Stammer M.D.   On: 07/21/2024 22:57    Anti-infectives: Anti-infectives (From admission, onward)    Start     Dose/Rate Route Frequency Ordered Stop   07/22/24 0600  ceFAZolin  (ANCEF ) IVPB 2g/100 mL premix        2 g 200 mL/hr over 30 Minutes Intravenous On call to O.R. 07/22/24 9780 07/22/24 0349       Assessment/Plan  58 y/o M POD 0 from open right inguinal hernia repair and dx lap after presenting with incarcerated inguinal hernia  - CLD. Can consider advancing later today when more awake - Likely will remain inpatient today but we will re-assess later today  FEN - CLD VTE - Lovenox  ID - None Dispo - Med/surg   LOS: 0 days   Cordella DELENA Polly Marlis Cheron Surgery 07/22/2024, 9:00 AM Please see Amion for pager number during day hours 7:00am-4:30pm or 7:00am -11:30am on weekends

## 2024-07-22 NOTE — TOC CM/SW Note (Signed)
 Transition of Care [facility]) - Inpatient Brief Assessment   Patient Details  Name: Johnathan [name] MRN: [id number] Date of Birth: Jan 08, 1966  Transition of Care [facility]) CM/SW Contact:    Tom-Johnson, Harvest Muskrat, RN Phone Number: 07/22/2024, 5:41 PM   Clinical Narrative:  Patient presented to the ED with Rt Groin and Scrotum pain with Nausea. Scrotal Ultrasound showed suspicion for Inguinal Hernia. CT scan showed Rt Inguinal Hernia. Patient underwent Open Rt Inguinal Hernia repair with Mesh. Patient is tolerating  Clear Liquid Diet. Completed IV abx. General sx following.   From home with wife, has three supportive children. Employed, independent with care and drive self. Has access to a cane at home.  PCP is Cleatus Arlyss RAMAN, MD and uses [facility] on [address] in [place].   No TOC needs or recommendations noted at this time.  Patient not Medically ready for discharge.  CM will continue to follow as patient progresses with care towards discharge.             Transition of Care Asessment: Insurance and Status: Insurance coverage has been reviewed Patient has primary care physician: Yes Home environment has been reviewed: Yes Prior level of function:: Independent Prior/Current Home Services: No current home services Social Drivers of Health Review: SDOH reviewed no interventions necessary Readmission risk has been reviewed: Yes Transition of care needs: no transition of care needs at this time

## 2024-07-22 NOTE — Anesthesia Preprocedure Evaluation (Signed)
 Anesthesia Evaluation  Patient identified by MRN, date of birth, ID band Patient awake    Reviewed: Allergy & Precautions, NPO status , Patient's Chart, lab work & pertinent test results  History of Anesthesia Complications (+) PONV and history of anesthetic complications  Airway Mallampati: III  TM Distance: >3 FB Neck ROM: Full    Dental  (+) Dental Advisory Given   Pulmonary neg pulmonary ROS   breath sounds clear to auscultation       Cardiovascular negative cardio ROS  Rhythm:Regular Rate:Normal     Neuro/Psych negative neurological ROS     GI/Hepatic Neg liver ROS, hiatal hernia,,,Incarcerated inguinal hernia   Endo/Other  negative endocrine ROS    Renal/GU negative Renal ROS     Musculoskeletal   Abdominal   Peds  Hematology negative hematology ROS (+)   Anesthesia Other Findings   Reproductive/Obstetrics                              Anesthesia Physical Anesthesia Plan  ASA: 2 and emergent  Anesthesia Plan: General   Post-op Pain Management: Ofirmev  IV (intra-op)* and Toradol  IV (intra-op)*   Induction: Rapid sequence and Intravenous  PONV Risk Score and Plan: 4 or greater and Dexamethasone , Ondansetron , Midazolam , Propofol  infusion, Treatment may vary due to age or medical condition and Scopolamine  patch - Pre-op  Airway Management Planned: Oral ETT  Additional Equipment: None  Intra-op Plan:   Post-operative Plan: Extubation in OR  Informed Consent: I have reviewed the patients History and Physical, chart, labs and discussed the procedure including the risks, benefits and alternatives for the proposed anesthesia with the patient or authorized representative who has indicated his/her understanding and acceptance.     Dental advisory given  Plan Discussed with: CRNA  Anesthesia Plan Comments:         Anesthesia Quick Evaluation

## 2024-07-22 NOTE — Op Note (Signed)
 Date: 07/22/24  Patient: Johnathan [name] MRN: [id number]  Preoperative Diagnosis: Incarcerated right inguinal hernia Postoperative Diagnosis: Same  Procedure:  Diagnostic laparoscopy Open right inguinal hernia repair with mesh patch  Surgeon: Leonor Dawn, MD  EBL: Minimal  Anesthesia: General endotracheal  Specimens: None  Indications: Johnathan [name] is a 58 yo male who presented to the ED with acute pain in the right groin that began one day ago. He had a nonreducible bulge on exam, and a CT scan showed a loop of small bowel within a right inguinal hernia. He was brought to the operating room emergently for hernia repair.  Findings: Indirect right inguinal hernia, which reduced prior to opening the hernia sac. The small bowel was viable on diagnostic laparoscopy.  Procedure details: Informed consent was obtained in the preoperative area prior to the procedure. The patient was brought to the operating room and placed on the table in the supine position. General anesthesia was induced and appropriate lines and drains were placed for intraoperative monitoring. Perioperative antibiotics were administered per SCIP guidelines. The abdomen and groins were prepped and draped in the usual sterile fashion. A pre-procedure timeout was taken verifying patient identity, surgical site and procedure to be performed.  The ASIS and pubic tubercle were identified, and a transverse incision was made in the right groin two fingerbreadths superior to the pubic tubercle. The subcutaneous tissue and Scarpa's fascia were divided with cautery to expose the external oblique fascia. The fascia was incised with a 15-blade scalpel and opened medially to the external inguinal ring using metzenbaum scissors, taking care to protect the ilioinguinal nerve. The cord structures were circumferentially dissected out using gentle blunt dissection, and encircled with a penrose. There was a large indirect hernia sac, and the contents  reduced during manipulation of the cord and sac. The ilioinguinal nerve and the vas deferens were visualized and protected. The indirect hernia sac was identified and dissected off the cord structures using blunt dissection and cautery. A high ligation of the sac was performed with a 3-0 vicryl suture, and the sac was then reduced into the abdomen. A diagnostic laparoscopy was then performed to assess the viability of the bowel. A small supraumbilical skin incision was made, the umbilical stalk was grasped and elevated, and a Veress needle was inserted through the fascia. Intraperitoneal placement was confirmed with the saline drop test, the abdomen was insufflated, and a 5mm Visiport was placed. There was a dilated loop of small bowel, and distal to this the small bowel adjacent to the reduced right inguinal hernia was visualization. The bowel all appeared viable with no signs of injury or ischemic. The abdomen was desufflated and the port was removed. A sheet of Ultrapro mesh was then brought onto the field and cut to size, with a slit to accommodate the cord structures. The mesh was secured medially to the pubic tubercle and inferiorly to the shelving edge of the inguinal ligament, using a running 0 PDS suture. Superiorly the mesh was secured to the conjoint tendon using interrupted 0 PDS. The tails of the mesh were sutured together laterally to recreate the internal inguinal ring. The ilioinguinal nerve was visualized throughout mesh placement, ensuring the nerve did not become entrapped in the mesh or sutures. The surgical site appeared hemostatic. The external oblique fascia was closed with a running 2-0 Vicryl suture. Scarpa's fascia was closed with a running 3-0 Vicryl, and the deep dermis was closed with interrupted 3-0 Vicryl suture. The subcuticular layer was closed with  a running 4-0 monocryl suture. The umbilical port site was closed with a 4-0 monocryl subcuticular suture. Dermabond was applied to both  incisions.  The patient tolerated the procedure well with no apparent complications. All counts were correct x2 at the end of the procedure. The patient was extubated and taken to PACU in stable condition.  Leonor Dawn, MD 07/22/24 4:42 AM

## 2024-07-22 NOTE — Plan of Care (Signed)
   Problem: Education: Goal: Knowledge of General Education information will improve Description Including pain rating scale, medication(s)/side effects and non-pharmacologic comfort measures Outcome: Progressing   Problem: Health Behavior/Discharge Planning: Goal: Ability to manage health-related needs will improve Outcome: Progressing

## 2024-07-22 NOTE — Plan of Care (Signed)
   Problem: Education: Goal: Knowledge of General Education information will improve Description Including pain rating scale, medication(s)/side effects and non-pharmacologic comfort measures Outcome: Progressing

## 2024-07-23 ENCOUNTER — Encounter (HOSPITAL_COMMUNITY): Payer: Self-pay | Admitting: Surgery

## 2024-07-23 ENCOUNTER — Other Ambulatory Visit (HOSPITAL_COMMUNITY): Payer: Self-pay

## 2024-07-23 MED ORDER — OXYCODONE HCL 5 MG PO TABS: 5.0000 mg | ORAL_TABLET | ORAL | Status: DC | PRN

## 2024-07-23 MED ORDER — METHOCARBAMOL 500 MG PO TABS: 750.0000 mg | ORAL_TABLET | Freq: Four times a day (QID) | ORAL | Status: DC

## 2024-07-23 MED ORDER — ACETAMINOPHEN 500 MG PO TABS: 1000.0000 mg | ORAL_TABLET | Freq: Three times a day (TID) | ORAL | Status: AC | PRN

## 2024-07-23 MED ORDER — POLYETHYLENE GLYCOL 3350 17 G PO PACK
17.0000 g | PACK | Freq: Every day | ORAL | Status: DC
  Administered 2024-07-23: 17 g via ORAL
  Filled 2024-07-23: qty 1

## 2024-07-23 MED ORDER — OXYCODONE HCL 5 MG PO TABS
5.0000 mg | ORAL_TABLET | Freq: Four times a day (QID) | ORAL | 0 refills | Status: AC | PRN
  Filled 2024-07-23: qty 15, 4d supply, fill #0

## 2024-07-23 NOTE — TOC Transition Note (Signed)
 Transition of Care [facility]) - Discharge Note   Patient Details  Name: Johnathan [name] MRN: [id number] Date of Birth: Jul 11, 1966  [facility]) CM/SW Contact:  Tom-Johnson, Harvest Muskrat, RN Phone Number: 07/23/2024, 12:24 PM   Clinical Narrative:     Patient is scheduled for discharge today.  Readmission Risk Assessment done. Outpatient f/u, hospital f/u and discharge instructions on AVS. Prescriptions sent to [facility] pharmacy and patient will receive meds prior discharge. No TOC needs or recommendations noted. Wife, Jon to transport at discharge.  No further TOC needs noted.       Final next level of care: Home/Self Care Barriers to Discharge: Barriers Resolved   Patient Goals and CMS Choice Patient states their goals for this hospitalization and ongoing recovery are:: To return home CMS Medicare.gov Compare Post Acute Care list provided to:: Patient Choice offered to / list presented to : NA      Discharge Placement                Patient to be transferred to facility by: Wife Name of family member notified: Angela    Discharge Plan and Services Additional resources added to the After Visit Summary for                  DME Arranged: N/A DME Agency: NA       HH Arranged: NA HH Agency: NA        Social Drivers of Health (SDOH) Interventions SDOH Screenings   Food Insecurity: Patient Declined (07/22/2024)  Housing: Low Risk  (07/22/2024)  Transportation Needs: No Transportation Needs (07/22/2024)  Utilities: Not At Risk (07/22/2024)  Depression (PHQ2-9): Low Risk  (08/28/2023)  Tobacco Use: Low Risk  (07/22/2024)     Readmission Risk Interventions    07/22/2024    5:41 PM  Readmission Risk Prevention Plan  Post Dischage Appt Complete  Medication Screening Complete  Transportation Screening Complete

## 2024-07-23 NOTE — Discharge Instructions (Signed)

## 2024-07-23 NOTE — Progress Notes (Addendum)
 1 Day Post-Op  Subjective: Patient reports pain near his incision that is well controlled with medications. Tolerating FLD without n/v. Passing flatus. No BM. Voiding.   Objective: Vital signs in last 24 hours: Temp:  [97.9 F (36.6 C)-98.7 F (37.1 C)] 97.9 F (36.6 C) (08/19 0500) Pulse Rate:  [54-63] 54 (08/19 0500) Resp:  [18] 18 (08/19 0500) BP: (110-131)/(69-82) 131/82 (08/19 0500) SpO2:  [96 %-99 %] 99 % (08/19 0500) Last BM Date : 07/21/24  Intake/Output from previous day: 08/18 0701 - 08/19 0700 In: 240 [P.O.:240] Out: -  Intake/Output this shift: No intake/output data recorded.  PE: Gen: male, NAD Abd: soft, non-distended, port site with dermabond intact, non-TTP R Groin: dermabond intact, no erythema, no bulge  Lab Results:  Recent Labs    07/21/24 2009  WBC 6.9  HGB 14.7  HCT 44.3  PLT 289   BMET Recent Labs    07/21/24 2009  NA 140  K 3.8  CL 108  CO2 23  GLUCOSE 124*  BUN 19  CREATININE 0.84  CALCIUM 9.0   PT/INR No results for input(s): LABPROT, INR in the last 72 hours. CMP     Component Value Date/Time   NA 140 07/21/2024 2009   K 3.8 07/21/2024 2009   CL 108 07/21/2024 2009   CO2 23 07/21/2024 2009   GLUCOSE 124 (H) 07/21/2024 2009   BUN 19 07/21/2024 2009   CREATININE 0.84 07/21/2024 2009   CALCIUM 9.0 07/21/2024 2009   PROT 6.2 (L) 07/21/2024 2009   ALBUMIN 3.6 07/21/2024 2009   AST 22 07/21/2024 2009   ALT 18 07/21/2024 2009   ALKPHOS 35 (L) 07/21/2024 2009   BILITOT 0.4 07/21/2024 2009   GFRNONAA >60 07/21/2024 2009   GFRAA >60 10/25/2016 1044   Lipase     Component Value Date/Time   LIPASE 31 07/21/2024 2009    Studies/Results: CT ABDOMEN PELVIS W CONTRAST Result Date: 07/22/2024 CLINICAL DATA:  Right-sided testicle pain EXAM: CT ABDOMEN AND PELVIS WITH CONTRAST TECHNIQUE: Multidetector CT imaging of the abdomen and pelvis was performed using the standard protocol following bolus administration of  intravenous contrast. RADIATION DOSE REDUCTION: This exam was performed according to the departmental dose-optimization program which includes automated exposure control, adjustment of the mA and/or kV according to patient size and/or use of iterative reconstruction technique. CONTRAST:  75mL OMNIPAQUE  IOHEXOL  350 MG/ML SOLN COMPARISON:  Ultrasound 07/21/2024, CT 09/21/2010 FINDINGS: Lower chest: Lung bases demonstrate no acute airspace disease. Hepatobiliary: No focal liver abnormality is seen. No gallstones, gallbladder wall thickening, or biliary dilatation. Pancreas: Unremarkable. No pancreatic ductal dilatation or surrounding inflammatory changes. Spleen: Normal in size without focal abnormality. Adrenals/Urinary Tract: Adrenal glands are normal. Kidneys show no hydronephrosis. Nonobstructing left kidney stone measuring 5 mm. The bladder is unremarkable Stomach/Bowel: The stomach is nonenlarged. Fluid-filled dilated mid to distal small bowel measuring up to 3.7 cm. Fecalized appearance of pelvic small bowel with associated mesenteric vascular congestion, transition point related to a right inguinal hernia containing fat and slightly dilated loops of small bowel. The small bowel/terminal ileum distal to the hernia is completely decompressed. Negative appendix. Fluid in the right scrotum. Diverticular disease of the colon without acute wall thickening. Vascular/Lymphatic: No significant vascular findings are present. No enlarged abdominal or pelvic lymph nodes. Reproductive: Prostate is unremarkable.  Small scrotal hydroceles. Other: Negative for pelvic effusion or free air. Right inguinal hernia containing fat and obstructed small bowel loop. Musculoskeletal: No acute osseous abnormality IMPRESSION: 1.  Findings consistent with small bowel obstruction secondary to incarcerated right inguinal hernia containing fat and obstructed small bowel loop. Mild mesenteric vascular congestion of the obstructed distal small  bowel. 2. Nonobstructing left kidney stone. Electronically Signed   By: Luke Bun M.D.   On: 07/22/2024 02:17   US  SCROTUM W/DOPPLER Result Date: 07/21/2024 CLINICAL DATA:  Right colon and testicular pain. Palpable lump the right groin area. EXAM: SCROTAL ULTRASOUND DOPPLER ULTRASOUND OF THE TESTICLES TECHNIQUE: Complete ultrasound examination of the testicles, epididymis, and other scrotal structures was performed. Color and spectral Doppler ultrasound were also utilized to evaluate blood flow to the testicles. COMPARISON:  None Available. FINDINGS: Right testicle Measurements: 4.9 x 2.6 x 3.2 cm. Symmetric and homogeneous echotexture without focal lesion. Patent arterial and venous blood flow. Left testicle Measurements: 4.4 x 2.8 x 3.0 cm. Symmetric and homogeneous echotexture without focal lesion. Patent arterial and venous blood flow. Right epididymis:  Normal in size and appearance. Left epididymis:  Normal in size and appearance. Hydrocele: Small/moderate bilateral hydroceles, left greater than right. Varicocele:  None visualized. Pulsed Doppler interrogation of both testes demonstrates normal low resistance arterial and venous waveforms bilaterally. Other: The patient's palpable abnormality in the right inguinal area corresponds to a fluid collection with some internal debris or possible bowel loop. Suspect inguinal hernia containing small bowel. CT scan suggested for further evaluation. IMPRESSION: 1. Normal sonographic appearance of both testicles. 2. Small/moderate bilateral hydroceles, left greater than right. 3. The patient's palpable abnormality in the right inguinal area corresponds to a fluid collection with some internal debris or possible bowel loop. Suspect inguinal hernia containing small bowel. CT scan suggested for further evaluation. Electronically Signed   By: MYRTIS Stammer M.D.   On: 07/21/2024 22:57    Anti-infectives: Anti-infectives (From admission, onward)    Start      Dose/Rate Route Frequency Ordered Stop   07/22/24 0600  ceFAZolin  (ANCEF ) IVPB 2g/100 mL premix        2 g 200 mL/hr over 30 Minutes Intravenous On call to O.R. 07/22/24 0219 07/22/24 0349       Assessment/Plan POD1 s/p diagnostic laparoscopy, open right inguinal hernia repair by Dr. Dasie on 07/22/24 for incarcerated inguinal hernia - Adv to soft diet.  - Mobilize, pulm toilet - Will ensure with attending that patient does not need to have a BM before discharge. If not, possible d/c later today if tolerating diet advancement and pain well controlled with PO medications. Discussed discharge instructions, restrictions and return/call back precautions. Will arrange follow up in the office. Will send pain medications to his pharmacy. A note was provided for work (works for [employer]).   FEN - Soft VTE - SCDs, Lovenox  ID - None Dispo - Med/surg, as above.    LOS: 1 day   Ozell HERO [facility] 07/23/2024, 10:49 AM Please see Amion for pager number during day hours 7:00am-4:30pm or 7:00am -11:30am on weekends

## 2024-07-23 NOTE — Anesthesia Postprocedure Evaluation (Signed)
 Anesthesia Post Note  Patient: Johnathan [name]  Procedure(s) Performed: LAPAROSCOPY, DIAGNOSTIC (Right) REPAIR, HERNIA, INGUINAL, INCARCERATED (Right)     Patient location during evaluation: PACU Anesthesia Type: General Level of consciousness: awake and alert Pain management: pain level controlled Vital Signs Assessment: post-procedure vital signs reviewed and stable Respiratory status: spontaneous breathing, nonlabored ventilation, respiratory function stable and patient connected to nasal cannula oxygen Cardiovascular status: blood pressure returned to baseline and stable Postop Assessment: no apparent nausea or vomiting Anesthetic complications: no   No notable events documented.  Last Vitals:  Vitals:   07/22/24 2011 07/23/24 0500  BP: 112/74 131/82  Pulse: 63 (!) 54  Resp: 18 18  Temp: 36.8 C 36.6 C  SpO2: 97% 99%    Last Pain:  Vitals:   07/23/24 0804  TempSrc:   PainSc: [name]

## 2024-07-25 NOTE — Discharge Summary (Signed)
 Patient ID: Johnathan [name] [id number] 31-Dec-1965 58 y.o.  Admit date: 07/21/2024 Discharge date: 07/23/24  Admitting Diagnosis: 58 yo male with an incarcerated right inguinal hernia containing small bowel.   Discharge Diagnosis POD1 s/p diagnostic laparoscopy, open right inguinal hernia repair by Dr. Dasie on 07/22/24 for incarcerated inguinal hernia   Consultants None  HPI: Mr. Johnathan [name] is a 58 yo male who presented to the ED with pain in the right groin and scrotum. It began yesterday and has gotten progressively worse. Today he also started having abdominal pain and nausea, but denies vomiting. He reports he is still having bowel function. In the ED he initially had a scrotal US , which showed normal appearances of both testicles but was suspicious for an inguinal hernia. The hernia was not able to be reduced. A CT scan was then done and showed a right inguinal hernia containing a loop of small bowel. General surgery was consulted.   The patient has not had any prior abdominal surgeries. He does not take any blood thinners and does not have any known cardiopulmonary disease. He does not smoke.  Procedures Dr. Dasie - 07/22/24 Diagnostic laparoscopy Open right inguinal hernia repair with mesh patch  Hospital Course:  Patient admitted as above and underwent diagnostic laparoscopy, open right inguinal hernia repair by Dr. Dasie on 07/22/24 for incarcerated inguinal hernia. Patient tolerated the procedure well. Diet was advanced post op. On POD1, the patient was voiding well, tolerating diet, ambulating well, pain well controlled, vital signs stable, incisions c/d/i and felt stable for discharge home. Follow up as noted below.   Allergies as of 07/23/2024       Reactions   Penicillins Hives, Rash   Has patient had a PCN reaction causing immediate rash, facial/tongue/throat swelling, SOB or lightheadedness with hypotension:  # # YES # #  Has patient had a PCN reaction causing severe rash  involving mucus membranes or skin necrosis: No Has patient had a PCN reaction that required hospitalization No Has patient had a PCN reaction occurring within the last 10 years: No If all of the above answers are NO, then may proceed with Cephalosporin use.   Zofran  [ondansetron  Hcl] Nausea And Vomiting, Other (See Comments)   PATIENT PREFERENCE -- DOES NOT HELP        Medication List     TAKE these medications    acetaminophen  500 MG tablet Commonly known as: TYLENOL  Take 2 tablets (1,000 mg total) by mouth every 8 (eight) hours as needed.   ECHINACEA PO Take 1 capsule by mouth daily.   ibuprofen  200 MG tablet Commonly known as: ADVIL  Take 600 mg by mouth daily as needed for mild pain (pain score 1-3).   oxyCODONE  5 MG immediate release tablet Commonly known as: Oxy IR/ROXICODONE  Take 1 tablet (5 mg total) by mouth every 6 (six) hours as needed for breakthrough pain.   vitamin C 1000 MG tablet Take 2,000 mg by mouth daily.          Follow-up Information     Dasie Leonor CROME, MD Follow up.   Specialty: General Surgery Why: Please call to confirm your appointment date and time. Please bring a copy of your photo ID, insurance card and arrive 30 minutes prior to your appointment for paperwork. Contact information: [address] [place] [address] [phone]                 Signed: Ozell CHRISTELLA Shaper, [facility] Surgery  07/25/2024, 2:37 PM Please see Amion for pager number during day hours 7:00am-4:30pm
